# Patient Record
Sex: Male | Born: 1995
Health system: Southern US, Community
[De-identification: ages and names within clinical notes are randomized; demographics above are authoritative.]

## PROBLEM LIST (undated history)

## (undated) DIAGNOSIS — R55 Syncope and collapse: Secondary | ICD-10-CM

## (undated) DIAGNOSIS — E162 Hypoglycemia, unspecified: Secondary | ICD-10-CM

## (undated) DIAGNOSIS — Z6830 Body mass index (BMI) 30.0-30.9, adult: Secondary | ICD-10-CM

## (undated) DIAGNOSIS — F419 Anxiety disorder, unspecified: Secondary | ICD-10-CM

## (undated) DIAGNOSIS — I429 Cardiomyopathy, unspecified: Secondary | ICD-10-CM

## (undated) DIAGNOSIS — U071 COVID-19: Secondary | ICD-10-CM

## (undated) DIAGNOSIS — K219 Gastro-esophageal reflux disease without esophagitis: Secondary | ICD-10-CM

## (undated) DIAGNOSIS — D693 Immune thrombocytopenic purpura: Secondary | ICD-10-CM

## (undated) HISTORY — PX: NO PAST SURGERIES: SHX2092

## (undated) HISTORY — DX: Syncope and collapse: R55

## (undated) HISTORY — DX: Immune thrombocytopenic purpura: D69.3

## (undated) HISTORY — DX: Anxiety disorder, unspecified: F41.9

## (undated) HISTORY — DX: Body mass index (BMI) 30.0-30.9, adult: Z68.30

## (undated) HISTORY — DX: Gastro-esophageal reflux disease without esophagitis: K21.9

## (undated) HISTORY — DX: Cardiomyopathy, unspecified: I42.9

---

## 2016-11-18 DIAGNOSIS — S4992XA Unspecified injury of left shoulder and upper arm, initial encounter: Secondary | ICD-10-CM | POA: Diagnosis not present

## 2016-11-18 DIAGNOSIS — M25522 Pain in left elbow: Secondary | ICD-10-CM | POA: Diagnosis not present

## 2018-02-06 DIAGNOSIS — Z23 Encounter for immunization: Secondary | ICD-10-CM | POA: Diagnosis not present

## 2018-02-06 DIAGNOSIS — Z6828 Body mass index (BMI) 28.0-28.9, adult: Secondary | ICD-10-CM | POA: Diagnosis not present

## 2018-02-18 DIAGNOSIS — J020 Streptococcal pharyngitis: Secondary | ICD-10-CM | POA: Diagnosis not present

## 2018-03-09 DIAGNOSIS — Z6828 Body mass index (BMI) 28.0-28.9, adult: Secondary | ICD-10-CM | POA: Diagnosis not present

## 2018-04-16 DIAGNOSIS — Z6827 Body mass index (BMI) 27.0-27.9, adult: Secondary | ICD-10-CM | POA: Diagnosis not present

## 2018-04-16 DIAGNOSIS — F902 Attention-deficit hyperactivity disorder, combined type: Secondary | ICD-10-CM | POA: Diagnosis not present

## 2018-07-07 DIAGNOSIS — M542 Cervicalgia: Secondary | ICD-10-CM | POA: Diagnosis not present

## 2018-07-07 DIAGNOSIS — M546 Pain in thoracic spine: Secondary | ICD-10-CM | POA: Diagnosis not present

## 2018-07-07 DIAGNOSIS — M9902 Segmental and somatic dysfunction of thoracic region: Secondary | ICD-10-CM | POA: Diagnosis not present

## 2018-07-07 DIAGNOSIS — M9901 Segmental and somatic dysfunction of cervical region: Secondary | ICD-10-CM | POA: Diagnosis not present

## 2018-07-09 DIAGNOSIS — M9902 Segmental and somatic dysfunction of thoracic region: Secondary | ICD-10-CM | POA: Diagnosis not present

## 2018-07-09 DIAGNOSIS — M546 Pain in thoracic spine: Secondary | ICD-10-CM | POA: Diagnosis not present

## 2018-07-09 DIAGNOSIS — M542 Cervicalgia: Secondary | ICD-10-CM | POA: Diagnosis not present

## 2018-07-09 DIAGNOSIS — M9901 Segmental and somatic dysfunction of cervical region: Secondary | ICD-10-CM | POA: Diagnosis not present

## 2018-10-15 DIAGNOSIS — Z1331 Encounter for screening for depression: Secondary | ICD-10-CM | POA: Diagnosis not present

## 2018-10-15 DIAGNOSIS — F321 Major depressive disorder, single episode, moderate: Secondary | ICD-10-CM | POA: Diagnosis not present

## 2018-10-15 DIAGNOSIS — F411 Generalized anxiety disorder: Secondary | ICD-10-CM | POA: Diagnosis not present

## 2018-10-15 DIAGNOSIS — Z6828 Body mass index (BMI) 28.0-28.9, adult: Secondary | ICD-10-CM | POA: Diagnosis not present

## 2018-11-16 DIAGNOSIS — F411 Generalized anxiety disorder: Secondary | ICD-10-CM | POA: Diagnosis not present

## 2018-11-16 DIAGNOSIS — Z20828 Contact with and (suspected) exposure to other viral communicable diseases: Secondary | ICD-10-CM | POA: Diagnosis not present

## 2018-11-16 DIAGNOSIS — F321 Major depressive disorder, single episode, moderate: Secondary | ICD-10-CM | POA: Diagnosis not present

## 2018-11-19 DIAGNOSIS — J209 Acute bronchitis, unspecified: Secondary | ICD-10-CM | POA: Diagnosis not present

## 2018-11-19 DIAGNOSIS — R05 Cough: Secondary | ICD-10-CM | POA: Diagnosis not present

## 2019-02-16 DIAGNOSIS — R05 Cough: Secondary | ICD-10-CM | POA: Diagnosis not present

## 2019-02-16 DIAGNOSIS — R519 Headache, unspecified: Secondary | ICD-10-CM | POA: Diagnosis not present

## 2019-02-19 DIAGNOSIS — Z20828 Contact with and (suspected) exposure to other viral communicable diseases: Secondary | ICD-10-CM | POA: Diagnosis not present

## 2019-02-19 DIAGNOSIS — R438 Other disturbances of smell and taste: Secondary | ICD-10-CM | POA: Diagnosis not present

## 2019-02-19 DIAGNOSIS — R0602 Shortness of breath: Secondary | ICD-10-CM | POA: Diagnosis not present

## 2019-02-19 DIAGNOSIS — R05 Cough: Secondary | ICD-10-CM | POA: Diagnosis not present

## 2019-02-20 DIAGNOSIS — R200 Anesthesia of skin: Secondary | ICD-10-CM | POA: Diagnosis not present

## 2019-02-20 DIAGNOSIS — R569 Unspecified convulsions: Secondary | ICD-10-CM | POA: Diagnosis not present

## 2019-02-20 DIAGNOSIS — R55 Syncope and collapse: Secondary | ICD-10-CM | POA: Diagnosis not present

## 2019-02-20 DIAGNOSIS — R202 Paresthesia of skin: Secondary | ICD-10-CM | POA: Diagnosis not present

## 2019-02-20 DIAGNOSIS — U071 COVID-19: Secondary | ICD-10-CM | POA: Diagnosis not present

## 2019-02-21 ENCOUNTER — Inpatient Hospital Stay (HOSPITAL_COMMUNITY)
Admission: AD | Admit: 2019-02-21 | Discharge: 2019-02-23 | DRG: 312 | Disposition: A | Discharge status: Home / Self Care | Payer: Federal, State, Local not specified - PPO | Source: Other Acute Inpatient Hospital | Attending: Internal Medicine | Admitting: Internal Medicine

## 2019-02-21 ENCOUNTER — Other Ambulatory Visit: Payer: Self-pay

## 2019-02-21 ENCOUNTER — Encounter (HOSPITAL_COMMUNITY): Payer: Self-pay

## 2019-02-21 DIAGNOSIS — R001 Bradycardia, unspecified: Secondary | ICD-10-CM | POA: Diagnosis not present

## 2019-02-21 DIAGNOSIS — I428 Other cardiomyopathies: Secondary | ICD-10-CM | POA: Diagnosis present

## 2019-02-21 DIAGNOSIS — R55 Syncope and collapse: Principal | ICD-10-CM | POA: Diagnosis present

## 2019-02-21 DIAGNOSIS — I5020 Unspecified systolic (congestive) heart failure: Secondary | ICD-10-CM

## 2019-02-21 DIAGNOSIS — Z8241 Family history of sudden cardiac death: Secondary | ICD-10-CM

## 2019-02-21 DIAGNOSIS — E11649 Type 2 diabetes mellitus with hypoglycemia without coma: Secondary | ICD-10-CM | POA: Diagnosis not present

## 2019-02-21 DIAGNOSIS — G40909 Epilepsy, unspecified, not intractable, without status epilepticus: Secondary | ICD-10-CM | POA: Diagnosis present

## 2019-02-21 DIAGNOSIS — R202 Paresthesia of skin: Secondary | ICD-10-CM | POA: Diagnosis not present

## 2019-02-21 DIAGNOSIS — Z7289 Other problems related to lifestyle: Secondary | ICD-10-CM

## 2019-02-21 DIAGNOSIS — U071 COVID-19: Secondary | ICD-10-CM | POA: Diagnosis not present

## 2019-02-21 DIAGNOSIS — R4020 Unspecified coma: Secondary | ICD-10-CM | POA: Diagnosis not present

## 2019-02-21 DIAGNOSIS — Z87891 Personal history of nicotine dependence: Secondary | ICD-10-CM | POA: Diagnosis not present

## 2019-02-21 DIAGNOSIS — R569 Unspecified convulsions: Secondary | ICD-10-CM

## 2019-02-21 HISTORY — DX: COVID-19: U07.1

## 2019-02-21 HISTORY — DX: Unspecified convulsions: R56.9

## 2019-02-21 HISTORY — DX: Syncope and collapse: R55

## 2019-02-21 HISTORY — DX: Hypoglycemia, unspecified: E16.2

## 2019-02-21 MED ORDER — ASPIRIN EC 81 MG PO TBEC
81.0000 mg | DELAYED_RELEASE_TABLET | Freq: Every day | ORAL | Status: DC
  Administered 2019-02-22 – 2019-02-23 (×2): 81 mg via ORAL
  Filled 2019-02-21 (×2): qty 1

## 2019-02-21 MED ORDER — SODIUM CHLORIDE 0.9% FLUSH: 3.0000 mL | INTRAVENOUS | Status: DC | PRN

## 2019-02-21 MED ORDER — VITAMIN C 500 MG PO TABS
500.0000 mg | ORAL_TABLET | Freq: Every day | ORAL | Status: DC
  Administered 2019-02-22 – 2019-02-23 (×2): 500 mg via ORAL
  Filled 2019-02-21 (×2): qty 1

## 2019-02-21 MED ORDER — ACETAMINOPHEN 325 MG PO TABS
650.0000 mg | ORAL_TABLET | Freq: Four times a day (QID) | ORAL | Status: DC | PRN
  Administered 2019-02-21 – 2019-02-22 (×2): 650 mg via ORAL
  Filled 2019-02-21 (×2): qty 2

## 2019-02-21 MED ORDER — ENOXAPARIN SODIUM 40 MG/0.4ML ~~LOC~~ SOLN
40.0000 mg | SUBCUTANEOUS | Status: DC
  Administered 2019-02-22 – 2019-02-23 (×2): 40 mg via SUBCUTANEOUS
  Filled 2019-02-21 (×2): qty 0.4

## 2019-02-21 MED ORDER — SODIUM CHLORIDE 0.9 % IV SOLN: 250.0000 mL | INTRAVENOUS | Status: DC | PRN

## 2019-02-21 MED ORDER — SODIUM CHLORIDE 0.9% FLUSH
3.0000 mL | Freq: Two times a day (BID) | INTRAVENOUS | Status: DC
  Administered 2019-02-22 (×2): 3 mL via INTRAVENOUS

## 2019-02-21 MED ORDER — ZINC SULFATE 220 (50 ZN) MG PO CAPS
220.0000 mg | ORAL_CAPSULE | Freq: Every day | ORAL | Status: DC
  Administered 2019-02-22 – 2019-02-23 (×2): 220 mg via ORAL
  Filled 2019-02-21 (×2): qty 1

## 2019-02-21 MED ORDER — FOLIC ACID 1 MG PO TABS
1.0000 mg | ORAL_TABLET | Freq: Every day | ORAL | Status: DC
  Administered 2019-02-22 – 2019-02-23 (×2): 1 mg via ORAL
  Filled 2019-02-21 (×2): qty 1

## 2019-02-21 MED ORDER — ADULT MULTIVITAMIN W/MINERALS CH
1.0000 | ORAL_TABLET | Freq: Every day | ORAL | Status: DC
  Administered 2019-02-22 – 2019-02-23 (×2): 1 via ORAL
  Filled 2019-02-21 (×2): qty 1

## 2019-02-22 ENCOUNTER — Encounter (HOSPITAL_COMMUNITY): Payer: Self-pay | Admitting: Neurology

## 2019-02-22 ENCOUNTER — Inpatient Hospital Stay (HOSPITAL_COMMUNITY): Payer: Federal, State, Local not specified - PPO

## 2019-02-22 ENCOUNTER — Observation Stay (HOSPITAL_COMMUNITY): Payer: Federal, State, Local not specified - PPO

## 2019-02-22 ENCOUNTER — Observation Stay (HOSPITAL_BASED_OUTPATIENT_CLINIC_OR_DEPARTMENT_OTHER): Payer: Federal, State, Local not specified - PPO

## 2019-02-22 DIAGNOSIS — Z87891 Personal history of nicotine dependence: Secondary | ICD-10-CM | POA: Diagnosis not present

## 2019-02-22 DIAGNOSIS — Z8241 Family history of sudden cardiac death: Secondary | ICD-10-CM | POA: Diagnosis not present

## 2019-02-22 DIAGNOSIS — I428 Other cardiomyopathies: Secondary | ICD-10-CM | POA: Diagnosis not present

## 2019-02-22 DIAGNOSIS — R569 Unspecified convulsions: Secondary | ICD-10-CM | POA: Diagnosis not present

## 2019-02-22 DIAGNOSIS — U071 COVID-19: Secondary | ICD-10-CM | POA: Diagnosis not present

## 2019-02-22 DIAGNOSIS — R001 Bradycardia, unspecified: Secondary | ICD-10-CM | POA: Diagnosis not present

## 2019-02-22 DIAGNOSIS — R4020 Unspecified coma: Secondary | ICD-10-CM | POA: Diagnosis not present

## 2019-02-22 DIAGNOSIS — R55 Syncope and collapse: Secondary | ICD-10-CM

## 2019-02-22 DIAGNOSIS — E11649 Type 2 diabetes mellitus with hypoglycemia without coma: Secondary | ICD-10-CM | POA: Diagnosis present

## 2019-02-22 DIAGNOSIS — I5020 Unspecified systolic (congestive) heart failure: Secondary | ICD-10-CM | POA: Diagnosis present

## 2019-02-22 DIAGNOSIS — Z7289 Other problems related to lifestyle: Secondary | ICD-10-CM | POA: Diagnosis not present

## 2019-02-22 DIAGNOSIS — G40909 Epilepsy, unspecified, not intractable, without status epilepticus: Secondary | ICD-10-CM | POA: Diagnosis present

## 2019-02-22 HISTORY — DX: Unspecified coma: R40.20

## 2019-02-22 HISTORY — DX: Unspecified systolic (congestive) heart failure: I50.20

## 2019-02-22 LAB — COMPREHENSIVE METABOLIC PANEL
ALT: 16 U/L (ref 0–44)
AST: 16 U/L (ref 15–41)
Albumin: 4 g/dL (ref 3.5–5.0)
Alkaline Phosphatase: 49 U/L (ref 38–126)
Anion gap: 9 (ref 5–15)
BUN: 11 mg/dL (ref 6–20)
CO2: 26 mmol/L (ref 22–32)
Calcium: 9 mg/dL (ref 8.9–10.3)
Chloride: 103 mmol/L (ref 98–111)
Creatinine, Ser: 0.96 mg/dL (ref 0.61–1.24)
GFR calc Af Amer: >60 mL/min (ref >60)
GFR calc non Af Amer: >60 mL/min (ref >60)
Glucose, Bld: 95 mg/dL (ref 70–99)
Potassium: 4.2 mmol/L (ref 3.5–5.1)
Sodium: 138 mmol/L (ref 135–145)
Total Bilirubin: 0.7 mg/dL (ref 0.3–1.2)
Total Protein: 6.5 g/dL (ref 6.5–8.1)

## 2019-02-22 LAB — CBC WITH DIFFERENTIAL/PLATELET
Abs Immature Granulocytes: 0.03 10*3/uL (ref 0.00–0.07)
Basophils Absolute: 0 10*3/uL (ref 0.0–0.1)
Basophils Relative: 0 %
Eosinophils Absolute: 0.1 10*3/uL (ref 0.0–0.5)
Eosinophils Relative: 1 %
HCT: 45.2 % (ref 39.0–52.0)
Hemoglobin: 15.3 g/dL (ref 13.0–17.0)
Immature Granulocytes: 1 %
Lymphocytes Relative: 28 %
Lymphs Abs: 1.7 10*3/uL (ref 0.7–4.0)
MCH: 28.9 pg (ref 26.0–34.0)
MCHC: 33.8 g/dL (ref 30.0–36.0)
MCV: 85.3 fL (ref 80.0–100.0)
Monocytes Absolute: 0.6 10*3/uL (ref 0.1–1.0)
Monocytes Relative: 10 %
Neutro Abs: 3.7 10*3/uL (ref 1.7–7.7)
Neutrophils Relative %: 60 %
Platelets: 175 10*3/uL (ref 150–400)
RBC: 5.3 MIL/uL (ref 4.22–5.81)
RDW: 12.3 % (ref 11.5–15.5)
WBC: 6.2 10*3/uL (ref 4.0–10.5)
nRBC: 0 % (ref 0.0–0.2)

## 2019-02-22 LAB — HIV ANTIBODY (ROUTINE TESTING W REFLEX): HIV Screen 4th Generation wRfx: NONREACTIVE

## 2019-02-22 LAB — TROPONIN I (HIGH SENSITIVITY)
Troponin I (High Sensitivity): 2 ng/L (ref <18)
Troponin I (High Sensitivity): 3 ng/L (ref <18)

## 2019-02-22 LAB — FIBRINOGEN: Fibrinogen: 318 mg/dL (ref 210–475)

## 2019-02-22 LAB — D-DIMER, QUANTITATIVE: D-Dimer, Quant: <0.27 ug/mL-FEU (ref 0.00–0.50)

## 2019-02-22 LAB — ABO/RH: ABO/RH(D): A POS

## 2019-02-22 LAB — PROCALCITONIN: Procalcitonin: <0.1 ng/mL

## 2019-02-22 LAB — ECHOCARDIOGRAM COMPLETE
Height: 74 in
Weight: 3650.82 oz

## 2019-02-22 LAB — LACTATE DEHYDROGENASE: LDH: 111 U/L (ref 98–192)

## 2019-02-22 LAB — FERRITIN: Ferritin: 66 ng/mL (ref 24–336)

## 2019-02-22 LAB — TSH: TSH: 1.765 u[IU]/mL (ref 0.350–4.500)

## 2019-02-22 LAB — C-REACTIVE PROTEIN: CRP: <0.8 mg/dL (ref <1.0)

## 2019-02-22 MED ORDER — GADOBUTROL 1 MMOL/ML IV SOLN
10.0000 mL | Freq: Once | INTRAVENOUS | Status: AC | PRN
  Administered 2019-02-22: 10 mL via INTRAVENOUS

## 2019-02-22 MED ORDER — TRAMADOL HCL 50 MG PO TABS
50.0000 mg | ORAL_TABLET | Freq: Four times a day (QID) | ORAL | Status: DC | PRN
  Administered 2019-02-22 – 2019-02-23 (×2): 50 mg via ORAL
  Filled 2019-02-22 (×2): qty 1

## 2019-02-22 MED ORDER — ONDANSETRON HCL 4 MG/2ML IJ SOLN
4.0000 mg | Freq: Four times a day (QID) | INTRAMUSCULAR | Status: DC | PRN
  Administered 2019-02-22: 4 mg via INTRAVENOUS
  Filled 2019-02-22: qty 2

## 2019-02-22 MED ORDER — KETOROLAC TROMETHAMINE 15 MG/ML IJ SOLN: 15.0000 mg | Freq: Two times a day (BID) | INTRAMUSCULAR | Status: DC

## 2019-02-22 MED ORDER — LEVETIRACETAM 500 MG PO TABS
500.0000 mg | ORAL_TABLET | Freq: Two times a day (BID) | ORAL | Status: DC
  Administered 2019-02-22 – 2019-02-23 (×3): 500 mg via ORAL
  Filled 2019-02-22 (×3): qty 1
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
## 2019-02-22 NOTE — Procedures (Signed)
Patient Name: Jesus Lopez  MRN: [id number]  Epilepsy Attending: Lora Havens  Referring Physician/Provider: Dr. Kerney Elbe Date: 02/22/2019 Duration: 23.05 minutes  Patient history: 23 year old male with episodes of passing out with seizure-like activity.  EEG to evaluate for seizures.  Level of alertness: Awake, sleep  AEDs during EEG study: Keppra  Technical aspects: This EEG study was done with scalp electrodes positioned according to the 10-20 International system of electrode placement. Electrical activity was acquired at a sampling rate of 500Hz  and reviewed with a high frequency filter of 70Hz  and a low frequency filter of 1Hz . EEG data were recorded continuously and digitally stored.  Description: During awake state, the posterior dominant rhythm consists of 10-11 Hz activity of moderate voltage (25-35 uV) seen predominantly in posterior head regions, symmetric and reactive to eye opening and eye closing.  Sleep was characterized by vertex waves, sleep spindles (12 to 14 Hz), K complexes, maximal frontocentral.  Physiologic photic driving was not seen during photic stimulation.  No EEG change was seen during hyperventilation.  IMPRESSION: This study is within normal limits. No seizures or epileptiform discharges were seen throughout the recording.  Jesus Lopez
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
## 2019-02-22 NOTE — Progress Notes (Signed)
EEG Completed; Results Pending  

## 2019-02-22 NOTE — Consult Note (Addendum)
Cardiology Consultation:   Patient ID: Jesus Lopez MRN: [id number]; DOB: August 06, 1995  Admit date: 02/21/2019 Date of Consult: 02/22/2019  Primary Care Provider: No primary care provider on file. Primary Cardiologist: Candee Furbish, MD  Primary Electrophysiologist:  None    Patient Profile:   Jesus Lopez is a 23 y.o. male with a hx of COVID positive test last Friday who is being seen today for the evaluation of syncope at the request of Dr. Lonny Prude.  History of Present Illness:   [name] has not seen cardiology before. The patient was seen in an Urgent care on Friday and tested positive for COVID.  He denies history of heart disease, cancer, stroke, diabetes, HTN, HLD, or seizure.   The patient presented to the [facility] ED 02/21/19 for multiple episodes of loss of conciousness. The first was on July 4 and he said this one was the worst. He had a significant amount of alcohol before he experienced LOC and had witnessed convulsions. 2 weeks ago he had another event after standing up from sitting position. He had some left sided numbness and confusion afterwards. And then on Saturday night he passed out during dinner and woke up a few minutes later with left sided numbness. Before he fainted he felt SOB and nausea and felt his vision tunneling. No CP or palpitations. After the third episode he decided to go to the ED for evaluation. CT head was negative for acute process. EKG without acute changes. COVID positive. Urine drug screen was negative. Patient was started on Keppra per neurology and was transferred to [facility] for further work-up.  On arrival to [facility] labs showed potassium 4.2, glucose 95, creatinine 0.96. WBC 6.2, Hgb 15.3, CRP normal. Procalcitonin normal. Hs troponin 2 > 3. D-dimer negative. EKG with no ischemic changes. Neurology following. Cardiology was consulted.  Patient quit smoking 2 weeks ago; occasional alcohol use. Denies drug use. Family history is positive for cardiac arrest  preceded by VT ? (fast heart rhythm).   Heart Pathway Score:     Past Medical History:  Diagnosis Date  . COVID-19   . Hypoglycemia   . Syncope     History reviewed. No pertinent surgical history.   Home Medications:  Prior to Admission medications   Medication Sig Start Date End Date Taking? Authorizing Provider  ibuprofen (ADVIL) 800 MG tablet Take 800 mg by mouth every 6 (six) hours as needed for pain. 02/19/19  Yes [provider]    Inpatient Medications: Scheduled Meds: . aspirin EC  81 mg Oral Daily  . enoxaparin (LOVENOX) injection  40 mg Subcutaneous Q24H  . folic acid  1 mg Oral Daily  . levETIRAcetam  500 mg Oral BID  . multivitamin with minerals  1 tablet Oral Daily  . sodium chloride flush  3 mL Intravenous Q12H  . vitamin C  500 mg Oral Daily  . zinc sulfate  220 mg Oral Daily   Continuous Infusions: . sodium chloride     PRN Meds: sodium chloride, acetaminophen, ondansetron (ZOFRAN) IV, sodium chloride flush, traMADol  Allergies:   No Known Allergies  Social History:   Social History   Socioeconomic History  . Marital status: Single    Spouse name: Not on file  . Number of children: Not on file  . Years of education: Not on file  . Highest education level: Not on file  Occupational History  . Not on file  Social Needs  . Financial resource strain: Not on file  .  Food insecurity    Worry: Not on file    Inability: Not on file  . Transportation needs    Medical: Not on file    Non-medical: Not on file  Tobacco Use  . Smoking status: Former Smoker    Types: Cigarettes    Quit date: 02/07/2019    Years since quitting: 0.0  . Smokeless tobacco: Never Used  Substance and Sexual Activity  . Alcohol use: Yes  . Drug use: Never  . Sexual activity: Yes  Lifestyle  . Physical activity    Days per week: Not on file    Minutes per session: Not on file  . Stress: Not on file  Relationships  . Social Musician on phone: Not  on file    Gets together: Not on file    Attends religious service: Not on file    Active member of club or organization: Not on file    Attends meetings of clubs or organizations: Not on file    Relationship status: Not on file  . Intimate partner violence    Fear of current or ex partner: Not on file    Emotionally abused: Not on file    Physically abused: Not on file    Forced sexual activity: Not on file  Other Topics Concern  . Not on file  Social History Narrative  . Not on file    Family History:   Family History  Problem Relation Age of Onset  . Hypertension Mother   . Hypertension Father   . Arrhythmia Father        with cardiac arrest     ROS:  Please see the history of present illness.  All other ROS reviewed and negative.     Physical Exam/Data:   Vitals:   02/21/19 2200 02/22/19 0031 02/22/19 0900  BP: 122/75 120/65 119/73  Pulse: (!) 50 64 62  Resp: 20 12 12   Temp: 97.8 F (36.6 C) 97.6 F (36.4 C) 98 F (36.7 C)  TempSrc: Oral Oral Oral  SpO2: 100% 100%   Weight: 103.5 kg    Height: 6\' 2"  (1.88 m)      Intake/Output Summary (Last 24 hours) at 02/22/2019 1521 Last data filed at 02/22/2019 1445 Gross per 24 hour  Intake 720 ml  Output -  Net 720 ml   Last 3 Weights 02/21/2019  Weight (lbs) 228 lb 2.8 oz  Weight (kg) 103.5 kg     Body mass index is 29.3 kg/m.  General:  Well nourished, well developed, in no acute distress HEENT: normal Lymph: no adenopathy Neck: no JVD Endocrine:  No thryomegaly Vascular: No carotid bruits; FA pulses 2+ bilaterally without bruits  Cardiac:  normal S1, S2; RRR; no murmur  Lungs:  clear to auscultation bilaterally, no wheezing, rhonchi or rales  Abd: soft, nontender, no hepatomegaly  Ext: no edema Musculoskeletal:  No deformities, BUE and BLE strength normal and equal Skin: warm and dry  Neuro:  CNs 2-12 intact, no focal abnormalities noted Psych:  Normal affect   EKG:  The EKG was personally  reviewed and demonstrates:  NSR, 69 bpm, QTC 422 ms, minor repol abnormalities.  Telemetry:  Telemetry was personally reviewed and demonstrates:  N/A  Relevant CV Studies:  Echo  1. Left ventricular ejection fraction, by visual estimation, is 40 to 45%. The left ventricle has moderately decreased function. There is no left ventricular hypertrophy.  2. Left ventricular diastolic parameters are indeterminate.  3.  The left ventricle demonstrates global hypokinesis.  4. Global right ventricle has normal systolic function.The right ventricular size is normal. No increase in right ventricular wall thickness.  5. Left atrial size was normal.  6. Right atrial size was normal.  7. The mitral valve is normal in structure. Trace mitral valve regurgitation.  8. The tricuspid valve is normal in structure. Tricuspid valve regurgitation is trivial.  9. The aortic valve is tricuspid. Aortic valve regurgitation is not visualized. No evidence of aortic valve sclerosis or stenosis. 10. The pulmonic valve was grossly normal. Pulmonic valve regurgitation is not visualized. 11. TR signal is inadequate for assessing pulmonary artery systolic pressure. 12. The inferior vena cava is dilated in size with <50% respiratory variability, suggesting right atrial pressure of 15 mmHg. 13. Hypermobile linear mass in right atrium, appears consistent with Chiari. However, cannot exclude fibrin or thormbus based on this study.  Laboratory Data:  High Sensitivity Troponin:   Recent Labs  Lab 02/22/19 0106  TROPONINIHS 3  2     Chemistry Recent Labs  Lab 02/22/19 0106  NA 138  K 4.2  CL 103  CO2 26  GLUCOSE 95  BUN 11  CREATININE 0.96  CALCIUM 9.0  GFRNONAA >60  GFRAA >60  ANIONGAP 9    Recent Labs  Lab 02/22/19 0106  PROT 6.5  ALBUMIN 4.0  AST 16  ALT 16  ALKPHOS 49  BILITOT 0.7   Hematology Recent Labs  Lab 02/22/19 0106  WBC 6.2  RBC 5.30  HGB 15.3  HCT 45.2  MCV 85.3  MCH 28.9  MCHC  33.8  RDW 12.3  PLT 175   BNPNo results for input(s): BNP, PROBNP in the last 168 hours.  DDimer  Recent Labs  Lab 02/22/19 0106  DDIMER <0.27     Radiology/Studies:  No results found.  Assessment and Plan:   Syncope Patient presented with multiple episodes of syncope with prodrome of tunnel vision - possibly seizure vs vasovagal - CT head negative for acute process - Neurology following. 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 Will possibly outpatient follow-up for possible cardiac MRI.  - HR in the 50s >> Would hold on BB at this time - MD to see  For questions or updates, please contact [facility] Please consult [email or web address] for contact info under     Signed, Cadence David StallH Furth, PA-C  02/22/2019 3:21 PM   Personally seen and examined. Agree with above.   23 year old male here with Covid positive test last Friday with a few syncopal episodes last one about 6 months ago all in the genre of vasovagal syncope.  Interestingly however he does have an EF of approximately 40 to 45% on echocardiogram.  Personally reviewed.  No hypertrophic changes.  States that at times he will get shaky, anxious, adrenaline rush.  Feels some numbness in his body.  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  Physical exam performed as above, see Fransico MichaelFurth PA.  Overall General alert and oriented  x3 no acute distress, no respiratory distress able to complete full  sentences without difficulty.  Troponin normal.  EKG unremarkable.  No hypertrophic cardiomyopathy-like changes.  Assessment and plan:  Vasovagal syncope -His description of his prior syncopal episodes first of which occurred 6 months prior to his Covid infection, do sound like they are in the genre of vasovagal syncope.  Continue with adequate hydration.  If he starts to feel these experiences come on he knows to sit down or lay down and put his feet up.  Sounds like at times he may hyperventilate causing paresthesias/tingling which can precipitate vasovagal syncope. -Nonetheless, we will order a Zio patch monitor 14-day for him as outpatient after his telemetry is done.  Reduced ejection fraction -EF 40 to 45% on echocardiogram personally reviewed.  This is lower than expected.  I think it would behoove [place] after he is over his Covid infection to have a cardiac MRI performed.  We will see him back as outpatient and have this arranged.  Family history of cardiac arrest -Currently no VT on monitoring.  I think it is important to perform Zio patch monitor.  No evidence of thickened left ventricle or hypertrophic cardiomyopathy at this point morphologically.  Checking MRI  Donato Schultz, MD

## 2019-02-22 NOTE — Progress Notes (Signed)
Orthostatic hypotension evaluation \\lying    119/73 Sitting   131/78 Standing  120/84

## 2019-02-22 NOTE — Progress Notes (Signed)
[facility]  PROGRESS NOTE  Jesus Lopez TKW:[id number] DOB: 10-09-1995 DOA: 02/21/2019 PCP: No primary care provider on file. Admit date - 02/21/2019   Admitting Physician Assunta Found, MD  Outpatient Primary MD for the patient is No primary care provider on file.  LOS - 1 Brief Narrative   Jesus Lopez is a 23 y.o. year old male with medical history significant for recent COVID-19 diagnosis who presented to [facility] ED on 02/21/2019 with episode of loss of consciousness while sitting on couch at dinner, preceded by a blue light in his vision. He reports confusion without fecal/urine incontinence afterwards but did have mouth numbness ( and witnessed slurred speech per his girlfriend) as well as left-sided numbness (face and arm) afterwards that resolved in 15 minutes.   Reports previous episode 2 weeks ago after standing up from sex felt lightheaded and weak. He passed out and his girlfriend witnessed whole body convulsions. He again reported having mouth numbness and total body numbness as well as full body aches. No fecal/urine incontinence at that time.   On July 4 after 8 beers and liquor he had witnessed convulsions.  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 He states he was diagnosed with hypoglycemia by his PCP.  He has a glucometer but it hasn't been working lately. Reports gluc of 89 at [place] ED prior to transfer     Transferred from [facility] ED due to concern for syncopal episode versus seizure-like activities and /or TIA.    Subjective  Mr.  Fenter today has headache, back pain, . Denies any current numbness or tingling or difficulty speaking.    A & P   Episodes of loss of consciousness.  Has had multiple episodes in the past few months. Weakness and numbness afterwards concerning for TIA. He reports associated with hypoglycemia but none  here so far. Also seizure on differential given confusion afterwards. Likely all worsened in the setting of COVID infection.  CT scan and UDS at outside hospital negative. Orthostatic vitals this am wnl.  Patient was loaded empirically with Keppra.  Neurology consulted, no seizure-like activity on spot EEG, needs MRI to rule out focal abnormalities that could be contributing to episode of loss of consciousness.  Patient needs to be admitted and monitored overnight while awaiting MRI imaging as well as monitoring of telemetry given family history of VT per cardiology,   Reduced EF of unclear etiology with family history of cardiac arrest.  Found on TTE on work-up of above.  Compensated on exam cardiology recommends cardiac MRI as an outpatient once he has recovered from his Covid infection.  No hypertrophic findings on TTE.  Plan for Zio patch monitor-outpatient 14 days after monitoring on telemetry overnight.  Covid infection positive.  No respiratory symptoms, on room air.  Serial monitoring markers, continue vitamin C and zinc    Family Communication  :  No family at bedside  Code Status :FULL  Disposition Plan  : Change status from observation to inpatient given ruling out potential seizure/stroke/TIA as well as cardiac etiologies.  Need to continue monitor on telemetry given history of VT and family before potential discharge For 40 hours, additionally awaiting MRI brain fluid any focal abnormalities that could be contributing to episodic loss of consciousness.  Consults  :  Neurology  Procedures  :  MRI Brain, telemetry monitoring  DVT Prophylaxis  :  Lovenox   Lab Results  Component Value Date   PLT 175 02/22/2019    Diet :  Diet Order            Diet regular Room service appropriate? Yes; Fluid consistency: Thin  Diet effective now               Inpatient Medications Scheduled Meds: . aspirin EC  81 mg Oral Daily  . enoxaparin (LOVENOX) injection  40 mg Subcutaneous Q24H   . folic acid  1 mg Oral Daily  . multivitamin with minerals  1 tablet Oral Daily  . sodium chloride flush  3 mL Intravenous Q12H  . vitamin C  500 mg Oral Daily  . zinc sulfate  220 mg Oral Daily   Continuous Infusions: . sodium chloride     PRN Meds:.sodium chloride, acetaminophen, sodium chloride flush  Antibiotics  :   Anti-infectives (From admission, onward)   None       Objective   Vitals:   02/21/19 2200 02/22/19 0031  BP: 122/75 120/65  Pulse: (!) 50 64  Resp: 20 12  Temp: 97.8 F (36.6 C) 97.6 F (36.4 C)  TempSrc: Oral Oral  SpO2: 100% 100%  Weight: 103.5 kg   Height: 6\' 2"  (1.88 m)     SpO2: 100 %  Wt Readings from Last 3 Encounters:  02/21/19 103.5 kg     Intake/Output Summary (Last 24 hours) at 02/22/2019 0834 Last data filed at 02/21/2019 2308 Gross per 24 hour  Intake 240 ml  Output -  Net 240 ml    Physical Exam:  Awake Alert, Oriented X 3, Normal affect Follows commands, 5/5 strengthh in bilateral upper and lower extremities, no appreciable focal deficits [place].AT, No JVD Symmetrical Chest wall movement, Good air movement bilaterally, CTAB RRR,No Gallops,Rubs or new Murmurs,  +ve B.Sounds, Abd Soft, No tenderness,  No rebound, guarding or rigidity. No Cyanosis, Clubbing or edema, No new Rash or bruise    I have personally reviewed the following:   Data Reviewed:  CBC Recent Labs  Lab 02/22/19 0106  WBC 6.2  HGB 15.3  HCT 45.2  PLT 175  MCV 85.3  MCH 28.9  MCHC 33.8  RDW 12.3  LYMPHSABS 1.7  MONOABS 0.6  EOSABS 0.1  BASOSABS 0.0    Chemistries  Recent Labs  Lab 02/22/19 0106  NA 138  K 4.2  CL 103  CO2 26  GLUCOSE 95  BUN 11  CREATININE 0.96  CALCIUM 9.0  AST 16  ALT 16  ALKPHOS 49  BILITOT 0.7   ------------------------------------------------------------------------------------------------------------------ No results for input(s): CHOL, HDL, LDLCALC, TRIG, CHOLHDL, LDLDIRECT in the last 72 hours.   No results found for: HGBA1C ------------------------------------------------------------------------------------------------------------------ No results for input(s): TSH, T4TOTAL, T3FREE, THYROIDAB in the last 72 hours.  Invalid input(s): FREET3 ------------------------------------------------------------------------------------------------------------------ Recent Labs    02/22/19 0106  FERRITIN 66    Coagulation profile No results for input(s): INR, PROTIME in the last 168 hours.  Recent Labs    02/22/19 0106  DDIMER <0.27    Cardiac Enzymes No results for input(s): CKMB, TROPONINI, MYOGLOBIN in the last 168 hours.  Invalid input(s): CK ------------------------------------------------------------------------------------------------------------------ No results found for: BNP  Micro Results No results found for this or any previous visit (from the past 240 hour(s)).  Radiology Reports No results found.   Time Spent in minutes  30     02/24/19 M.D on 02/22/2019 at 8:34 AM  To page go to [email or web address] -  password [facility]

## 2019-02-22 NOTE — Progress Notes (Signed)
Echocardiogram 2D Echocardiogram has been performed.  Oneal Deputy Poonam Woehrle 02/22/2019, 9:07 AM

## 2019-02-23 ENCOUNTER — Other Ambulatory Visit: Payer: Self-pay | Admitting: Medical

## 2019-02-23 ENCOUNTER — Encounter (HOSPITAL_COMMUNITY): Payer: Self-pay | Admitting: *Deleted

## 2019-02-23 ENCOUNTER — Telehealth: Payer: Self-pay | Admitting: Cardiology

## 2019-02-23 ENCOUNTER — Telehealth: Payer: Self-pay | Admitting: *Deleted

## 2019-02-23 DIAGNOSIS — I5020 Unspecified systolic (congestive) heart failure: Secondary | ICD-10-CM

## 2019-02-23 DIAGNOSIS — R55 Syncope and collapse: Secondary | ICD-10-CM

## 2019-02-23 DIAGNOSIS — R4020 Unspecified coma: Secondary | ICD-10-CM

## 2019-02-23 LAB — BASIC METABOLIC PANEL
Anion gap: 11 (ref 5–15)
BUN: 15 mg/dL (ref 6–20)
CO2: 26 mmol/L (ref 22–32)
Calcium: 9.1 mg/dL (ref 8.9–10.3)
Chloride: 105 mmol/L (ref 98–111)
Creatinine, Ser: 1.02 mg/dL (ref 0.61–1.24)
GFR calc Af Amer: >60 mL/min (ref >60)
GFR calc non Af Amer: >60 mL/min (ref >60)
Glucose, Bld: 90 mg/dL (ref 70–99)
Potassium: 4 mmol/L (ref 3.5–5.1)
Sodium: 142 mmol/L (ref 135–145)

## 2019-02-23 LAB — CBC
HCT: 45.4 % (ref 39.0–52.0)
Hemoglobin: 15.8 g/dL (ref 13.0–17.0)
MCH: 29.3 pg (ref 26.0–34.0)
MCHC: 34.8 g/dL (ref 30.0–36.0)
MCV: 84.2 fL (ref 80.0–100.0)
Platelets: 172 10*3/uL (ref 150–400)
RBC: 5.39 MIL/uL (ref 4.22–5.81)
RDW: 12 % (ref 11.5–15.5)
WBC: 7.2 10*3/uL (ref 4.0–10.5)
nRBC: 0 % (ref 0.0–0.2)

## 2019-02-23 MED ORDER — LEVETIRACETAM 500 MG PO TABS: 500.0000 mg | ORAL_TABLET | Freq: Two times a day (BID) | ORAL | 0 refills | Status: DC

## 2019-02-23 MED ORDER — ZINC SULFATE 220 (50 ZN) MG PO CAPS: 220.0000 mg | ORAL_CAPSULE | Freq: Every day | ORAL | 0 refills | Status: DC

## 2019-02-23 MED ORDER — ASCORBIC ACID 500 MG PO TABS: 500.0000 mg | ORAL_TABLET | Freq: Every day | ORAL | 0 refills | Status: DC

## 2019-02-23 NOTE — Telephone Encounter (Signed)
New Message  Cadence Kathlen Mody PA calling in to get 30 day live event monitor set up with patient. Please give patient a call to go over monitor.

## 2019-02-23 NOTE — Progress Notes (Signed)
Patient discharged per orders. Heart failure/seizure education done with time allowed for questions/concerns. Prescriptions discussed. Prescriptions sent electronically to patient's preferred pharmacy. Follow up appointments discussed. Patient taken to the main entrance by CNA for transportation provided by patient's girlfriend. Soyla Dryer RN

## 2019-02-23 NOTE — Progress Notes (Signed)
MRI brain normal.   EEG was also normal.   A/R: 23 year old male presenting with multiple spells where patient would lose consciousness in conjunction with presyncopal sensation, followed by convulsive activity.    1. Cardiology has seen the patient and given the patient's lower than expected EF, a cardiac MRI is being considered.  Also per Cardiology, to evaluate possible convulsive syncope, he will be monitored with a Zio patch for 14 days after discharge.   2. Although syncopal convulsions are now felt to be the most likely component of the DDx, epileptic seizures are also of high enough likelihood to justify continuing Keppra during admission and upon discharge. If Zio monitor reveals that the etiology was vasovagal syncope then Keppra taper should be considered. For this he will need close outpatient Neurology follow up.  3. Per [facility] statutes, patients with seizures are not allowed to drive until  they have been seizure-free for six months. Use caution when using heavy equipment or power tools. Avoid working on ladders or at heights. Take showers instead of baths. Ensure the water temperature is not too high on the home water heater. Do not go swimming alone. When caring for infants or small children, sit down when holding, feeding, or changing them to minimize risk of injury to the child in the event you have a seizure. Also, Maintain good sleep hygiene. Avoid alcohol. 4. Neurohospitalist service will sign off. Please call if there are additional questions.   Electronically signed: Dr. Kerney Elbe

## 2019-02-23 NOTE — Care Management (Signed)
CM spoke with pt via phone prior to discharge.  Pt is independent from home.  Pt informed CM that he has PCP in [address] [place].  Pt also informed CM that he is has Development worker, international aid through [employer].  Pt denied barriers with paying for prescriptions.  Pt has thermometer in the home.  Pts girlfriend will transport him home.  NO CM needs determined at this time - CM signing off

## 2019-02-23 NOTE — Discharge Instructions (Signed)
You were admitted for syncope (sudden loss of consciousness). This may be because of seizure.  You will need to continue taking your Keppra until seen by a neurologist.  Outpatient follow-up is provided in AVS. You also need to be seen by your cardiologist.  Appointment also addressed in your after visit summary.  They will order cardiac MRI as an outpatient as well as set you up with a Zio patch at discharge       Seizure Precautions Per [facility] statutes, patients with seizures are not allowed to drive until  they have been seizure-free for six months. Use caution when using heavy equipment or power tools. Avoid working on ladders or at heights. Take showers instead of baths. Ensure the water temperature is not too high on the home water heater. Do not go swimming alone. When caring for infants or small children, sit down when holding, feeding, or changing them to minimize risk of injury to the child in the event you have a seizure. Also, Maintain good sleep hygiene. Avoid alcohol.

## 2019-02-23 NOTE — Progress Notes (Addendum)
Progress Note  Patient Name: Jesus Lopez Date of Encounter: 02/23/2019  Primary Cardiologist: Donato Schultz, MD   Subjective   Patient is feeling good today. Ready to go home.   Inpatient Medications    Scheduled Meds:  aspirin EC  81 mg Oral Daily   enoxaparin (LOVENOX) injection  40 mg Subcutaneous Q24H   folic acid  1 mg Oral Daily   levETIRAcetam  500 mg Oral BID   multivitamin with minerals  1 tablet Oral Daily   sodium chloride flush  3 mL Intravenous Q12H   vitamin C  500 mg Oral Daily   zinc sulfate  220 mg Oral Daily   Continuous Infusions:  sodium chloride     PRN Meds: sodium chloride, acetaminophen, ondansetron (ZOFRAN) IV, sodium chloride flush, traMADol   Vital Signs    Vitals:   02/21/19 2200 02/22/19 0031 02/22/19 0900 02/22/19 1600  BP: 122/75 120/65 119/73 122/72  Pulse: (!) 50 64 62   Resp: 20 12 12 12   Temp: 97.8 F (36.6 C) 97.6 F (36.4 C) 98 F (36.7 C) 98.1 F (36.7 C)  TempSrc: Oral Oral Oral Oral  SpO2: 100% 100%    Weight: 103.5 kg     Height: 6\' 2"  (1.88 m)       Intake/Output Summary (Last 24 hours) at 02/23/2019 0931 Last data filed at 02/22/2019 1445 Gross per 24 hour  Intake 480 ml  Output --  Net 480 ml   Last 3 Weights 02/21/2019  Weight (lbs) 228 lb 2.8 oz  Weight (kg) 103.5 kg      Telemetry    NSR, HR 50-60s; bradycardia overnight, peak low 39 bpm - Personally Reviewed  ECG    No new - Personally Reviewed  Physical Exam   GEN: No acute distress.   Neck: No JVD Cardiac: RRR, no murmurs, rubs, or gallops.  Respiratory: Clear to auscultation bilaterally. GI: Soft, nontender, non-distended  MS: No edema; No deformity. Neuro:  Nonfocal  Psych: Normal affect   Labs    High Sensitivity Troponin:   Recent Labs  Lab 02/22/19 0106  TROPONINIHS 3   2      Chemistry Recent Labs  Lab 02/22/19 0106 02/23/19 0415  NA 138 142  K 4.2 4.0  CL 103 105  CO2 26 26  GLUCOSE 95 90  BUN 11 15   CREATININE 0.96 1.02  CALCIUM 9.0 9.1  PROT 6.5  --   ALBUMIN 4.0  --   AST 16  --   ALT 16  --   ALKPHOS 49  --   BILITOT 0.7  --   GFRNONAA >60 >60  GFRAA >60 >60  ANIONGAP 9 11     Hematology Recent Labs  Lab 02/22/19 0106 02/23/19 0415  WBC 6.2 7.2  RBC 5.30 5.39  HGB 15.3 15.8  HCT 45.2 45.4  MCV 85.3 84.2  MCH 28.9 29.3  MCHC 33.8 34.8  RDW 12.3 12.0  PLT 175 172    BNPNo results for input(s): BNP, PROBNP in the last 168 hours.   DDimer  Recent Labs  Lab 02/22/19 0106  DDIMER <0.27     Radiology    Mr Laqueta Jean Wo Contrast  Result Date: 02/22/2019 CLINICAL DATA:  23 year old male with recent COVID-19. Diagnosis. Seizure. EXAM: MRI HEAD WITHOUT AND WITH CONTRAST TECHNIQUE: Multiplanar, multiecho pulse sequences of the brain and surrounding structures were obtained without and with intravenous contrast. CONTRAST:  36mL GADAVIST GADOBUTROL 1 MMOL/ML IV  SOLN The patient experienced nausea and vomiting after contrast injection. COMPARISON:  [facility] head CT without contrast 02/21/2019. FINDINGS: Brain: Cerebral volume is within normal limits. Mild asymmetry of the occipital horns appears to be normal variation. No restricted diffusion to suggest acute infarction. No midline shift, mass effect, evidence of mass lesion, ventriculomegaly, extra-axial collection or acute intracranial hemorrhage. Cervicomedullary junction and pituitary are within normal limits. Thin slice coronal T2 and FLAIR images demonstrate no definite hippocampal asymmetry or signal abnormality. Pearline Cables and white matter signal is within normal limits throughout the brain. No chronic cerebral blood products. No abnormal enhancement identified. No dural thickening. Vascular: Major intracranial vascular flow voids are preserved. The right vertebral artery appears dominant. The major dural venous sinuses are enhancing and appear to be patent. Skull and upper cervical spine: Negative visible cervical  spine. Visualized bone marrow signal is within normal limits. Sinuses/Orbits: Negative orbits. Trace paranasal sinus mucosal thickening. Other: Mastoids are clear. Visible internal auditory structures appear normal. Scalp and face soft tissues appear negative. IMPRESSION: No acute intracranial abnormality. Normal MRI appearance of the brain. Electronically Signed   By: Genevie Ann M.D.   On: 02/22/2019 23:49    Cardiac Studies   Echo 1. Left ventricular ejection fraction, by visual estimation, is 40 to 45%. The left ventricle has moderately decreased function. There is no left ventricular hypertrophy. 2. Left ventricular diastolic parameters are indeterminate. 3. The left ventricle demonstrates global hypokinesis. 4. Global right ventricle has normal systolic function.The right ventricular size is normal. No increase in right ventricular wall thickness. 5. Left atrial size was normal. 6. Right atrial size was normal. 7. The mitral valve is normal in structure. Trace mitral valve regurgitation. 8. The tricuspid valve is normal in structure. Tricuspid valve regurgitation is trivial. 9. The aortic valve is tricuspid. Aortic valve regurgitation is not visualized. No evidence of aortic valve sclerosis or stenosis. 10. The pulmonic valve was grossly normal. Pulmonic valve regurgitation is not visualized. 11. TR signal is inadequate for assessing pulmonary artery systolic pressure. 12. The inferior vena cava is dilated in size with <50% respiratory variability, suggesting right atrial pressure of 15 mmHg. 13. Hypermobile linear mass in right atrium, appears consistent with Chiari. However, cannot exclude fibrin or thormbus based on this study.  Patient Profile     23 y.o. male with a hx of COVID positive test last Friday who is being seen today for the evaluation of syncope at the request of Dr. Lonny Prude.  Assessment & Plan    Syncope Patient presented with multiple episodes of syncope with  prodrome of tunnel vision - possibly vasovagal - CT head negative for acute process - Neurology following. 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 Possible outpatient follow-up for cardiac MRI.  - HR in the 50-60ss >> Would hold on BB at this time - MD to see   For questions or updates, please contact [facility] Please consult [email or web address] for contact info under        Signed, Cadence Ninfa Meeker, PA-C  02/23/2019, 9:31 AM    Personally seen and examined. Agree with above.   Tele normal, sinus brady. No pauses. He feels good, no  CP.  Likely etiology for syncope is vaso vagal.  He is going to continue with Keppra just in case seizure.  EF 45%. Will obtain cardiac MRI as outpatient. No Bb, BP does not allow ACE-I.  OK for DC from our perspective.   Donato Schultz, MD

## 2019-02-23 NOTE — Telephone Encounter (Signed)
Call cannot be completed using phone number on record [phone].

## 2019-02-25 NOTE — Discharge Summary (Signed)
Jesus Lopez LKG:[id number] DOB: 1995/06/18 DOA: 02/21/2019  PCP: No primary care provider on file.  Admit date: 02/21/2019 Discharge date: 02/25/2019  Admitted From:Home Disposition:  Home  Recommendations for Outpatient Follow-up:  1. Follow up with PCP in 1-2 weeks 2. Continue Keppra, Cardiology and Neurology follow up arranged 3. COVID precautions on discharge provided  Home Health:NO Equipment/Devices:none Discharge Condition: Stable CODE STATUS:FULL Diet recommendation: Heart Healthy  Brief/Interim Summary: History of present illness:  Jesus Lopez is a 23 y.o. year old male with medical history significant for recent COVID-19 diagnosis who presented to [facility] ED on 02/21/2019 with episode of loss of consciousness while sitting on couch at dinner, preceded by a blue light in his vision. He reports confusion without fecal/urine incontinence afterwards but did have mouth numbness ( and witnessed slurred speech per his girlfriend) as well as left-sided numbness (face and arm) afterwards that resolved in 15 minutes.   Reports previous episode 2 weeks ago after standing up from sex felt lightheaded and weak. He passed out and his girlfriend witnessed whole body convulsions. He again reported having mouth numbness and total body numbness as well as full body aches. No fecal/urine incontinence at that time.   On July 4 after 8 beers and liquor he had witnessed convulsions.  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 He states he was diagnosed with hypoglycemia by his PCP.  He has a glucometer but it hasn't been working lately. Reports gluc of 89 at [facility] prior to transfer     Transferred from [facility] ED due to concern for syncopal episode versus seizure-like activities and /or TIA.     Remaining hospital course addressed in problem based format  below:   Hospital Course:   Episodes of loss of consciousness in conjunction with presyncopal sensation followed by convulsive activity.  Work-up included negative CT head, UDS (obtained at [facility]), normal orthostatic vitals, negative EEG, nonacute MRI.  Differential includes possible convulsive syncope, no witnessed episodes during monitoring in the hospital.  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  New cardiomyopathy with EF 40-45%.  Found on TTE and work-up for above.  Compensated on exam telemetry monitoring showed mild bradycardia with heart rate in 50s to 60s. -Cardiology plans for outpatient cardiac MRI once Covid status resolved. -Given bradycardia, cardiology recommends holding on beta-blocker -We will follow with cardiology  Covid infection positive without hypoxia.  Mild symptoms included subjective fever, headache, abdominal pain.  Maintain normal oxygen saturation on room air.  Normal telemetry markers. -Continue vitamin C and zinc -Covid precautions provided on day of discharge.   Consultations:  Neurology, cardiology  Procedures/Studies: TTE, 11/16:  1. Left ventricular ejection fraction, by visual estimation, is 40 to 45%. The left ventricle has moderately decreased function. There is no left ventricular hypertrophy.  2. Left ventricular diastolic parameters are indeterminate.  3. The left ventricle demonstrates global hypokinesis.  4. Global right ventricle has normal systolic function.The right ventricular size is normal. No increase in right ventricular wall thickness.  5. Left atrial size was normal.  6. Right atrial size was normal.  7. The mitral valve is normal in structure. Trace mitral  valve regurgitation.  8. The tricuspid  valve is normal in structure. Tricuspid valve regurgitation is trivial.  9. The aortic valve is tricuspid. Aortic valve regurgitation is not visualized. No evidence of aortic valve sclerosis or stenosis. 10. The pulmonic valve was grossly normal. Pulmonic valve regurgitation is not visualized. 11. TR signal is inadequate for assessing pulmonary artery systolic pressure. 12. The inferior vena cava is dilated in size with <50% respiratory variability, suggesting right atrial pressure of 15 mmHg. 13. Hypermobile linear mass in right atrium, appears consistent with Chiari. However, cannot exclude fibrin or thormbus based on this study. Subjective:  Discharge Exam: Vitals:   02/22/19 0900 02/22/19 1600  BP: 119/73 122/72  Pulse: 62   Resp: 12 12  Temp: 98 F (36.7 C) 98.1 F (36.7 C)  SpO2:     Vitals:   02/21/19 2200 02/22/19 0031 02/22/19 0900 02/22/19 1600  BP: 122/75 120/65 119/73 122/72  Pulse: (!) 50 64 62   Resp: 20 12 12 12   Temp: 97.8 F (36.6 C) 97.6 F (36.4 C) 98 F (36.7 C) 98.1 F (36.7 C)  TempSrc: Oral Oral Oral Oral  SpO2: 100% 100%    Weight: 103.5 kg     Height: 6\' 2"  (1.88 m)       Young male, lying in bed in no distress Awake Alert, Oriented X 3, Normal affect Follows commands, 5/5 strengthh in bilateral upper and lower extremities, no appreciable focal deficits [place].AT, No JVD Symmetrical Chest wall movement, Good air movement bilaterally on room air, CTAB RRR,No Gallops,Rubs or new Murmurs,  +ve B.Sounds, Abd Soft, No tenderness,  No rebound, guarding or rigidity. No Cyanosis, Clubbing or edema, No new Rash or bruise   Discharge Diagnoses:  Active Problems:   COVID-19   Seizures (HCC)   Heart failure with reduced ejection fraction (HCC)   Loss of consciousness [facility])    Discharge Instructions  Discharge Instructions    MyChart COVID-19 home monitoring program   Complete by: Feb 23, 2019    Is the patient  willing to use the MyChart Mobile App for home monitoring?: Yes   Temperature monitoring   Complete by: Feb 23, 2019    After how many days would you like to receive a notification of this patient's flowsheet entries?: 1   Diet - low sodium heart healthy   Complete by: As directed    Increase activity slowly   Complete by: As directed      Allergies as of 02/23/2019   No Known Allergies     Medication List    STOP taking these medications   ibuprofen 800 MG tablet Commonly known as: ADVIL     TAKE these medications   ascorbic acid 500 MG tablet Commonly known as: VITAMIN C Take 1 tablet (500 mg total) by mouth daily.   levETIRAcetam 500 MG tablet Commonly known as: KEPPRA Take 1 tablet (500 mg total) by mouth 2 (two) times daily.   zinc sulfate 220 (50 Zn) MG capsule Take 1 capsule (220 mg total) by mouth daily.      Follow-up Information    Rosalio Macadamia, NP Follow up on 03/15/2019.   Specialties: Nurse Practitioner, Interventional Cardiology, Cardiology, Radiology Why: Please go to hospital follow up December 7th at 11:00 AM Contact information: [address] [place] [address] [phone]        [facility]. Schedule an appointment as soon as possible for a visit in 4 week(s).   Why: call and make an appointment  Contact information: [address]     [place] [id number] [phone]         No Known Allergies      The results of significant diagnostics from this hospitalization (including imaging, microbiology, ancillary and laboratory) are listed below for reference.     Microbiology: No results found for this or any previous visit (from the past 240 hour(s)).   Labs: BNP (last 3 results) No results for input(s): BNP in the last 8760 hours. Basic Metabolic Panel: Recent Labs  Lab 02/22/19 0106 02/23/19 0415  NA 138 142  K 4.2 4.0  CL 103 105  CO2 26 26  GLUCOSE  95 90  BUN 11 15  CREATININE 0.96 1.02  CALCIUM 9.0 9.1   Liver Function Tests: Recent Labs  Lab 02/22/19 0106  AST 16  ALT 16  ALKPHOS 49  BILITOT 0.7  PROT 6.5  ALBUMIN 4.0   No results for input(s): LIPASE, AMYLASE in the last 168 hours. No results for input(s): AMMONIA in the last 168 hours. CBC: Recent Labs  Lab 02/22/19 0106 02/23/19 0415  WBC 6.2 7.2  NEUTROABS 3.7  --   HGB 15.3 15.8  HCT 45.2 45.4  MCV 85.3 84.2  PLT 175 172   Cardiac Enzymes: No results for input(s): CKTOTAL, CKMB, CKMBINDEX, TROPONINI in the last 168 hours. BNP: Invalid input(s): POCBNP CBG: No results for input(s): GLUCAP in the last 168 hours. D-Dimer No results for input(s): DDIMER in the last 72 hours. Hgb A1c No results for input(s): HGBA1C in the last 72 hours. Lipid Profile No results for input(s): CHOL, HDL, LDLCALC, TRIG, CHOLHDL, LDLDIRECT in the last 72 hours. Thyroid function studies No results for input(s): TSH, T4TOTAL, T3FREE, THYROIDAB in the last 72 hours.  Invalid input(s): FREET3 Anemia work up No results for input(s): VITAMINB12, FOLATE, FERRITIN, TIBC, IRON, RETICCTPCT in the last 72 hours. Urinalysis No results found for: COLORURINE, APPEARANCEUR, LABSPEC, [place], GLUCOSEU, HGBUR, BILIRUBINUR, KETONESUR, PROTEINUR, UROBILINOGEN, NITRITE, LEUKOCYTESUR Sepsis Labs Invalid input(s): PROCALCITONIN,  WBC,  LACTICIDVEN Microbiology No results found for this or any previous visit (from the past 240 hour(s)).   Time coordinating discharge: Over 30 minutes  SIGNED:   Desiree Hane, MD  [facility] 02/25/2019, 1:28 PM Pager   If 7PM-7AM, please contact night-coverage [email or web address] Password [email or web address]

## 2019-03-10 ENCOUNTER — Ambulatory Visit (INDEPENDENT_AMBULATORY_CARE_PROVIDER_SITE_OTHER): Payer: Federal, State, Local not specified - PPO

## 2019-03-10 ENCOUNTER — Other Ambulatory Visit: Payer: Self-pay

## 2019-03-10 ENCOUNTER — Telehealth: Payer: Self-pay

## 2019-03-10 DIAGNOSIS — R55 Syncope and collapse: Secondary | ICD-10-CM

## 2019-03-10 NOTE — Telephone Encounter (Signed)
S/w pt to see if pt had event monitor on due to upcoming appt with Truitt Merle, NP on Monday, December 7.  Pt has never received monitor.  Looking through pt's chart the wrong phone number was called. Pt asked if can come in office today to get monitor placed on.  S/w April Louretta Shorten and Janan Halter and stated pt can get monitor put on today in office. Pt recently tested positive for Covid but has been cleared to go back to work.  Pt is calling back due to license has been taken away from pt due to seizures and has to get a ride to make sure pt can keep monitor appt today.  Pt has been added on to monitor schedule and has been sent to precert.  Pt's  upcoming appt has been R/S to see Dr. Marlou Porch in 6 weeks whom pt seen in the hospital.  Will confirm appt with pt when pt calls back.

## 2019-03-10 NOTE — Telephone Encounter (Signed)
30 day Event monitor placed on pt in office today.   [id number]

## 2019-03-11 NOTE — Progress Notes (Deleted)
CARDIOLOGY OFFICE NOTE  Date:  03/11/2019    Jesus Lopez Date of Birth: January 05, 1996 Medical Record #[id number]  PCP:  Dulce Sellar, NP  Cardiologist:  Tyrone Sage & ***    No chief complaint on file.   History of Present Illness: Jesus Lopez is a 23 y.o. male who presents today for a ***  The patient {does/does not:200015} have symptoms concerning for COVID-19 infection (fever, chills, cough, or new shortness of breath).   Comes in today. Here with   Past Medical History:  Diagnosis Date  . COVID-19   . Hypoglycemia   . Syncope     No past surgical history on file.   Medications: No outpatient medications have been marked as taking for the 03/15/19 encounter (Appointment) with Rosalio Macadamia, NP.     Allergies: No Known Allergies  Social History: The patient  reports that he quit smoking about 4 weeks ago. His smoking use included cigarettes. He has never used smokeless tobacco. He reports current alcohol use. He reports that he does not use drugs.   Family History: The patient's ***family history includes Arrhythmia in his father; Hypertension in his father and mother.   Review of Systems: Please see the history of present illness.   All other systems are reviewed and negative.   Physical Exam: VS:  There were no vitals taken for this visit. Marland Kitchen  BMI There is no height or weight on file to calculate BMI.  Wt Readings from Last 3 Encounters:  02/21/19 228 lb 2.8 oz (103.5 kg)    General: Pleasant. Well developed, well nourished and in no acute distress.   HEENT: Normal.  Neck: Supple, no JVD, carotid bruits, or masses noted.  Cardiac: ***Regular rate and rhythm. No murmurs, rubs, or gallops. No edema.  Respiratory:  Lungs are clear to auscultation bilaterally with normal work of breathing.  GI: Soft and nontender.  MS: No deformity or atrophy. Gait and ROM intact.  Skin: Warm and dry. Color is normal.  Neuro:  Strength and sensation are  intact and no gross focal deficits noted.  Psych: Alert, appropriate and with normal affect.   LABORATORY DATA:  EKG:  EKG {ACTION; IS/IS [id number]} ordered today. This demonstrates ***.  Lab Results  Component Value Date   WBC 7.2 02/23/2019   HGB 15.8 02/23/2019   HCT 45.4 02/23/2019   PLT 172 02/23/2019   GLUCOSE 90 02/23/2019   ALT 16 02/22/2019   AST 16 02/22/2019   NA 142 02/23/2019   K 4.0 02/23/2019   CL 105 02/23/2019   CREATININE 1.02 02/23/2019   BUN 15 02/23/2019   CO2 26 02/23/2019   TSH 1.765 02/22/2019     BNP (last 3 results) No results for input(s): BNP in the last 8760 hours.  ProBNP (last 3 results) No results for input(s): PROBNP in the last 8760 hours.   Other Studies Reviewed Today:   Assessment/Plan:  . COVID-19 Education: The signs and symptoms of COVID-19 were discussed with the patient and how to seek care for testing (follow up with PCP or arrange E-visit).  The importance of social distancing, staying at home, hand hygiene and wearing a mask when out in public were discussed today.  Current medicines are reviewed with the patient today.  The patient does not have concerns regarding medicines other than what has been noted above.  The following changes have been made:  See above.  Labs/ tests ordered today include:   No  orders of the defined types were placed in this encounter.    Disposition:   FU with *** in {gen number 5-01:586825} {Days to years:10300}.   Patient is agreeable to this plan and will call if any problems develop in the interim.   SignedTruitt Merle, NP  03/11/2019 7:17 AM  [facility] [address] [name], [place]  [address] Phone: [phone] Fax: [phone]

## 2019-03-15 ENCOUNTER — Ambulatory Visit: Payer: Self-pay | Admitting: Nurse Practitioner

## 2019-04-23 ENCOUNTER — Encounter: Payer: Self-pay | Admitting: Cardiology

## 2019-04-23 ENCOUNTER — Ambulatory Visit (INDEPENDENT_AMBULATORY_CARE_PROVIDER_SITE_OTHER): Payer: Federal, State, Local not specified - PPO | Admitting: Cardiology

## 2019-04-23 ENCOUNTER — Other Ambulatory Visit: Payer: Self-pay

## 2019-04-23 VITALS — BP 120/66 | HR 80 | Ht 74.0 in | Wt 227.0 lb

## 2019-04-23 DIAGNOSIS — R55 Syncope and collapse: Secondary | ICD-10-CM

## 2019-04-23 DIAGNOSIS — Z01812 Encounter for preprocedural laboratory examination: Secondary | ICD-10-CM | POA: Diagnosis not present

## 2019-04-23 DIAGNOSIS — I429 Cardiomyopathy, unspecified: Secondary | ICD-10-CM

## 2019-04-23 DIAGNOSIS — I5020 Unspecified systolic (congestive) heart failure: Secondary | ICD-10-CM

## 2019-04-23 NOTE — Progress Notes (Signed)
Cardiology Office Note:    Date:  04/23/2019   ID:  HIRO VIPOND, DOB 1995/09/16, MRN [id number]  PCP:  Dulce Sellar, NP  Cardiologist:  Donato Schultz, MD  Electrophysiologist:  None   Referring MD: No ref. provider found     History of Present Illness:    Jesus Lopez is a 24 y.o. male with prior COVID-19 infection in November 2020, prior license had been taken away due to seizures, I saw him in hospital  Telemetry in the hospital reviewed showed heart rates 50s to 60s bradycardia at night low as 39 but this is benign in the setting.  Echocardiogram reviewed showed the following: Echo 1. Left ventricular ejection fraction, by visual estimation, is 40 to 45%. The left ventricle has moderately decreased function. There is no left ventricular hypertrophy. 2. Left ventricular diastolic parameters are indeterminate. 3. The left ventricle demonstrates global hypokinesis. 4. Global right ventricle has normal systolic function.The right ventricular size is normal. No increase in right ventricular wall thickness. 5. Left atrial size was normal. 6. Right atrial size was normal. 7. The mitral valve is normal in structure. Trace mitral valve regurgitation. 8. The tricuspid valve is normal in structure. Tricuspid valve regurgitation is trivial. 9. The aortic valve is tricuspid. Aortic valve regurgitation is not visualized. No evidence of aortic valve sclerosis or stenosis. 10. The pulmonic valve was grossly normal. Pulmonic valve regurgitation is not visualized. 11. TR signal is inadequate for assessing pulmonary artery systolic pressure. 12. The inferior vena cava is dilated in size with <50% respiratory variability, suggesting right atrial pressure of 15 mmHg. 13. Hypermobile linear mass in right atrium, appears consistent with Chiari. However, cannot exclude fibrin or thormbus based on this study.   Feels SOB with physical activity, hard to catch her breath.  Living will  value would, severely tired, short winded.  Having some numbness in his arms hands.  Extreme fatigue.  Laying in bed, no chest wall throb, hard heartbeats.  Off seizure meds. If stand to long legs like Jello. Been dealing with this before COVID, over a year and a half. Not diabetes. Feet purple when sit, cold feet.  Discussed echocardiogram with him.  Very frustrated with his symptoms.  Past Medical History:  Diagnosis Date  . COVID-19   . Hypoglycemia   . Syncope     History reviewed. No pertinent surgical history.  Current Medications: No outpatient medications have been marked as taking for the 04/23/19 encounter (Office Visit) with Jake Bathe, MD.     Allergies:   Patient has no known allergies.   Social History   Socioeconomic History  . Marital status: Single    Spouse name: Not on file  . Number of children: Not on file  . Years of education: Not on file  . Highest education level: Not on file  Occupational History  . Not on file  Tobacco Use  . Smoking status: Former Smoker    Types: Cigarettes    Quit date: 02/07/2019    Years since quitting: 0.2  . Smokeless tobacco: Never Used  Substance and Sexual Activity  . Alcohol use: Yes  . Drug use: Never  . Sexual activity: Yes  Other Topics Concern  . Not on file  Social History Narrative  . Not on file   Social Determinants of Health   Financial Resource Strain: Low Risk   . Difficulty of Paying Living Expenses: Not very hard  Food Insecurity: No Food Insecurity  .  Worried About Charity fundraiser in the Last Year: Never true  . Ran Out of Food in the Last Year: Never true  Transportation Needs: No Transportation Needs  . Lack of Transportation (Medical): No  . Lack of Transportation (Non-Medical): No  Physical Activity: Unknown  . Days of Exercise per Week: Patient refused  . Minutes of Exercise per Session: Patient refused  Stress: Stress Concern Present  . Feeling of Stress : To some extent  Social  Connections: Unknown  . Frequency of Communication with Friends and Family: Patient refused  . Frequency of Social Gatherings with Friends and Family: Patient refused  . Attends Religious Services: Patient refused  . Active Member of Clubs or Organizations: Patient refused  . Attends Archivist Meetings: Patient refused  . Marital Status: Patient refused     Family History: The patient's family history includes Arrhythmia in his father; Hypertension in his father and mother.  ROS:   Please see the history of present illness.    No fevers chills.  Mild residual cough.  All other systems reviewed and are negative.  EKGs/Labs/Other Studies Reviewed:    The following studies were reviewed today: Hospital records and echocardiogram reviewed  EKG: Not ordered today  Recent Labs: 02/22/2019: ALT 16; TSH 1.765 02/23/2019: BUN 15; Creatinine, Ser 1.02; Hemoglobin 15.8; Platelets 172; Potassium 4.0; Sodium 142  Recent Lipid Panel No results found for: CHOL, TRIG, HDL, CHOLHDL, VLDL, LDLCALC, LDLDIRECT  Physical Exam:    VS:  BP 120/66   Pulse 80   Ht 6\' 2"  (1.88 m)   Wt 227 lb (103 kg)   SpO2 98%   BMI 29.15 kg/m     Wt Readings from Last 3 Encounters:  04/23/19 227 lb (103 kg)  02/21/19 228 lb 2.8 oz (103.5 kg)     GEN:  Well nourished, well developed in no acute distress HEENT: Normal NECK: No JVD; No carotid bruits LYMPHATICS: No lymphadenopathy CARDIAC: RRR, no murmurs, rubs, gallops RESPIRATORY:  Clear to auscultation without rales, wheezing or rhonchi  ABDOMEN: Soft, non-tender, non-distended MUSCULOSKELETAL:  No edema; No deformity  SKIN: Warm and dry NEUROLOGIC:  Alert and oriented x 3 PSYCHIATRIC:  Normal affect   ASSESSMENT:    1. Heart failure with reduced ejection fraction (HCC)   2. Cardiomyopathy, unspecified type ([place])   3. Pre-procedure lab exam   4. Syncope and collapse    PLAN:    In order of problems listed  above:  Syncope -Multiple prior episodes of syncope with prodrome of tunnel vision possibly vasovagal.  He gets some mild nausea, clammy skin, sweating.  CT of the head was negative for any acute process neurology showed EEG negative for seizure activity and brain MRI was otherwise normal.  Echocardiogram demonstrated EF of 40 to 45% trace MR trivial TR.  Telemetry showed sinus rhythm in the hospital orthostatics were normal.  TSH was normal.  Obviously frustrated with the way he has been feeling over the past year.  Encouraged salt liberalization and fluid intake.  Cardiac event monitor on 04/20/2019:  Sinus rhythm with average HR 75 bpm  No atrial fibrillation, no adverse arrhythmias  Occasional premature atrial complexes (symptom of fluttering noted).  Sinus bradycardia noted at night (40-50's) benign.   Reassuring monitor Candee Furbish, MD  Cardiomyopathy -EF was 40 to 45% in the hospital setting in November surrounding Covid.  No beta-blocker. -We will check cardiac MRI -We will check exercise treadmill test since he has the symptoms  when exerting himself. -Currently on no medications, blood pressure 120/66 currently.  -We will have him come back in in 1 month.   Medication Adjustments/Labs and Tests Ordered: Current medicines are reviewed at length with the patient today.  Concerns regarding medicines are outlined above.  Orders Placed This Encounter  Procedures  . MR Card Morphology Wo/W Cm  . Basic Metabolic Panel (BMET)  . Exercise Tolerance Test   No orders of the defined types were placed in this encounter.   Patient Instructions  Medication Instructions:  The current medical regimen is effective;  continue present plan and medications.  *If you need a refill on your cardiac medications before your next appointment, please call your pharmacy*  Lab Work: If you be scheduled for blood work before your Cardiac MRI if needed. (BMP) If you have labs (blood work) drawn  today and your tests are completely normal, you will receive your results only by: Marland Kitchen MyChart Message (if you have MyChart) OR . A paper copy in the mail If you have any lab test that is abnormal or we need to change your treatment, we will call you to review the results.  Testing/Procedures: Your physician has requested that you have an exercise tolerance test. For further information please visit [email or web address] Please also follow instruction sheet, as given.  Your physician has requested that you have a cardiac MRI. Cardiac MRI uses a computer to create images of your heart as its beating, producing both still and moving pictures of your heart and major blood vessels. For further information please visit [email or web address].   Follow-Up: At [facility], you and your health needs are our priority.  As part of our continuing mission to provide you with exceptional heart care, we have created designated Provider Care Teams.  These Care Teams include your primary Cardiologist (physician) and Advanced Practice Providers (APPs -  Physician Assistants and Nurse Practitioners) who all work together to provide you with the care you need, when you need it.  Your next appointment:   1 month(s)  The format for your next appointment:   In Person  Provider:   Donato Schultz, MD  Thank you for choosing [facility]!!        Signed, Donato Schultz, MD  04/23/2019 10:32 AM    [facility]

## 2019-04-23 NOTE — Patient Instructions (Signed)
Medication Instructions:  The current medical regimen is effective;  continue present plan and medications.  *If you need a refill on your cardiac medications before your next appointment, please call your pharmacy*  Lab Work: If you be scheduled for blood work before your Cardiac MRI if needed. (BMP) If you have labs (blood work) drawn today and your tests are completely normal, you will receive your results only by: Marland Kitchen MyChart Message (if you have MyChart) OR . A paper copy in the mail If you have any lab test that is abnormal or we need to change your treatment, we will call you to review the results.  Testing/Procedures: Your physician has requested that you have an exercise tolerance test. For further information please visit [email or web address] Please also follow instruction sheet, as given.  Your physician has requested that you have a cardiac MRI. Cardiac MRI uses a computer to create images of your heart as its beating, producing both still and moving pictures of your heart and major blood vessels. For further information please visit [email or web address].   Follow-Up: At [facility], you and your health needs are our priority.  As part of our continuing mission to provide you with exceptional heart care, we have created designated Provider Care Teams.  These Care Teams include your primary Cardiologist (physician) and Advanced Practice Providers (APPs -  Physician Assistants and Nurse Practitioners) who all work together to provide you with the care you need, when you need it.  Your next appointment:   1 month(s)  The format for your next appointment:   In Person  Provider:   Donato Schultz, MD  Thank you for choosing [facility]!!

## 2019-04-30 ENCOUNTER — Other Ambulatory Visit (HOSPITAL_COMMUNITY)
Admission: RE | Admit: 2019-04-30 | Discharge: 2019-04-30 | Disposition: A | Discharge status: Home / Self Care | Payer: Federal, State, Local not specified - PPO | Source: Ambulatory Visit | Attending: Cardiology | Admitting: Cardiology

## 2019-04-30 ENCOUNTER — Telehealth: Payer: Self-pay | Admitting: Cardiology

## 2019-04-30 ENCOUNTER — Encounter: Payer: Self-pay | Admitting: Cardiology

## 2019-04-30 DIAGNOSIS — Z20822 Contact with and (suspected) exposure to covid-19: Secondary | ICD-10-CM | POA: Diagnosis not present

## 2019-04-30 DIAGNOSIS — Z01812 Encounter for preprocedural laboratory examination: Secondary | ICD-10-CM | POA: Diagnosis not present

## 2019-04-30 LAB — SARS CORONAVIRUS 2 (TAT 6-24 HRS): SARS Coronavirus 2: NEGATIVE

## 2019-04-30 NOTE — Telephone Encounter (Signed)
Spoke with patient regarding cardiac mri appointment scheduled 05/12/19 at 1:00 pm at [facility]--arrival time is 12:15pm 1st floor admissions office---will mail information to patient and it is also in My Chart

## 2019-05-04 ENCOUNTER — Ambulatory Visit (INDEPENDENT_AMBULATORY_CARE_PROVIDER_SITE_OTHER): Payer: Federal, State, Local not specified - PPO

## 2019-05-04 ENCOUNTER — Other Ambulatory Visit: Payer: Self-pay

## 2019-05-04 DIAGNOSIS — I429 Cardiomyopathy, unspecified: Secondary | ICD-10-CM

## 2019-05-04 DIAGNOSIS — I5020 Unspecified systolic (congestive) heart failure: Secondary | ICD-10-CM

## 2019-05-04 LAB — EXERCISE TOLERANCE TEST
Estimated workload: 17 METS
Exercise duration (min): 9 min
Exercise duration (sec): 58 s
MPHR: 196 {beats}/min
Peak HR: 155 {beats}/min
Percent HR: 79 %
RPE: 17
Rest HR: 71 {beats}/min

## 2019-05-05 ENCOUNTER — Telehealth: Payer: Self-pay

## 2019-05-05 NOTE — Telephone Encounter (Signed)
-----   Message from Jake Bathe, MD sent at 05/05/2019 12:53 PM EST ----- Overall reassuring exercise treadmill test with normal blood pressure response.  No evidence of ischemia. Donato Schultz, MD

## 2019-05-05 NOTE — Telephone Encounter (Signed)
lpmtcb 1/27 

## 2019-05-06 ENCOUNTER — Telehealth: Payer: Self-pay

## 2019-05-06 NOTE — Telephone Encounter (Signed)
The patient has been notified of the stress test result and verbalized understanding.  All questions (if any) were answered. Raiford Fetterman, RN 05/06/2019 8:53 AM

## 2019-05-06 NOTE — Telephone Encounter (Signed)
-----   Message from Mark C Skains, MD sent at 05/05/2019 12:53 PM EST ----- Overall reassuring exercise treadmill test with normal blood pressure response.  No evidence of ischemia. Mark Skains, MD  

## 2019-05-11 ENCOUNTER — Telehealth (HOSPITAL_COMMUNITY): Payer: Self-pay | Admitting: Emergency Medicine

## 2019-05-11 ENCOUNTER — Encounter (HOSPITAL_COMMUNITY): Payer: Self-pay

## 2019-05-11 NOTE — Telephone Encounter (Signed)
Reaching out to patient to offer assistance regarding upcoming cardiac imaging study; pt verbalizes understanding of appt date/time, parking situation and where to check in,and verified current allergies; name and call back number provided for further questions should they arise Rockwell Alexandria RN Navigator Cardiac Imaging [facility] [phone] office [phone] cell  Pt denies metal implants, shrapnel, claustrophobia, denies covid symptoms

## 2019-05-12 ENCOUNTER — Telehealth: Payer: Self-pay | Admitting: Cardiology

## 2019-05-12 ENCOUNTER — Ambulatory Visit (HOSPITAL_COMMUNITY)
Admission: RE | Admit: 2019-05-12 | Discharge: 2019-05-12 | Disposition: A | Discharge status: Home / Self Care | Payer: Federal, State, Local not specified - PPO | Source: Ambulatory Visit | Attending: Cardiology | Admitting: Cardiology

## 2019-05-12 ENCOUNTER — Other Ambulatory Visit: Payer: Self-pay

## 2019-05-12 ENCOUNTER — Emergency Department (HOSPITAL_COMMUNITY)
Admission: EM | Admit: 2019-05-12 | Discharge: 2019-05-12 | Disposition: A | Discharge status: Home / Self Care | Payer: Federal, State, Local not specified - PPO | Attending: Emergency Medicine | Admitting: Emergency Medicine

## 2019-05-12 DIAGNOSIS — Z87891 Personal history of nicotine dependence: Secondary | ICD-10-CM | POA: Insufficient documentation

## 2019-05-12 DIAGNOSIS — T782XXA Anaphylactic shock, unspecified, initial encounter: Secondary | ICD-10-CM | POA: Diagnosis not present

## 2019-05-12 DIAGNOSIS — I429 Cardiomyopathy, unspecified: Secondary | ICD-10-CM | POA: Insufficient documentation

## 2019-05-12 DIAGNOSIS — Z8616 Personal history of COVID-19: Secondary | ICD-10-CM | POA: Diagnosis not present

## 2019-05-12 DIAGNOSIS — I959 Hypotension, unspecified: Secondary | ICD-10-CM | POA: Diagnosis not present

## 2019-05-12 DIAGNOSIS — I5020 Unspecified systolic (congestive) heart failure: Secondary | ICD-10-CM

## 2019-05-12 DIAGNOSIS — R0789 Other chest pain: Secondary | ICD-10-CM | POA: Diagnosis not present

## 2019-05-12 LAB — BASIC METABOLIC PANEL
Anion gap: 8 (ref 5–15)
BUN: 14 mg/dL (ref 6–20)
CO2: 24 mmol/L (ref 22–32)
Calcium: 8.9 mg/dL (ref 8.9–10.3)
Chloride: 106 mmol/L (ref 98–111)
Creatinine, Ser: 0.95 mg/dL (ref 0.61–1.24)
GFR calc Af Amer: >60 mL/min (ref >60)
GFR calc non Af Amer: >60 mL/min (ref >60)
Glucose, Bld: 106 mg/dL — ABNORMAL HIGH (ref 70–99)
Potassium: 3.2 mmol/L — ABNORMAL LOW (ref 3.5–5.1)
Sodium: 138 mmol/L (ref 135–145)

## 2019-05-12 LAB — CBC WITH DIFFERENTIAL/PLATELET
Abs Immature Granulocytes: 0.04 10*3/uL (ref 0.00–0.07)
Basophils Absolute: 0 10*3/uL (ref 0.0–0.1)
Basophils Relative: 0 %
Eosinophils Absolute: 0.1 10*3/uL (ref 0.0–0.5)
Eosinophils Relative: 1 %
HCT: 44.1 % (ref 39.0–52.0)
Hemoglobin: 15.2 g/dL (ref 13.0–17.0)
Immature Granulocytes: 0 %
Lymphocytes Relative: 37 %
Lymphs Abs: 4.2 10*3/uL — ABNORMAL HIGH (ref 0.7–4.0)
MCH: 29 pg (ref 26.0–34.0)
MCHC: 34.5 g/dL (ref 30.0–36.0)
MCV: 84.2 fL (ref 80.0–100.0)
Monocytes Absolute: 1 10*3/uL (ref 0.1–1.0)
Monocytes Relative: 9 %
Neutro Abs: 5.9 10*3/uL (ref 1.7–7.7)
Neutrophils Relative %: 53 %
Platelets: 232 10*3/uL (ref 150–400)
RBC: 5.24 MIL/uL (ref 4.22–5.81)
RDW: 11.9 % (ref 11.5–15.5)
WBC: 11.3 10*3/uL — ABNORMAL HIGH (ref 4.0–10.5)
nRBC: 0 % (ref 0.0–0.2)

## 2019-05-12 MED ORDER — DIPHENHYDRAMINE HCL 50 MG/ML IJ SOLN
INTRAMUSCULAR | Status: AC
  Filled 2019-05-12: qty 1

## 2019-05-12 MED ORDER — PREDNISONE 50 MG PO TABS: 50.0000 mg | ORAL_TABLET | Freq: Every day | ORAL | 0 refills | Status: AC

## 2019-05-12 MED ORDER — EPINEPHRINE 0.3 MG/0.3ML IJ SOAJ: 0.3000 mg | INTRAMUSCULAR | 1 refills | Status: DC | PRN

## 2019-05-12 MED ORDER — METHYLPREDNISOLONE SODIUM SUCC 125 MG IJ SOLR
125.0000 mg | INTRAMUSCULAR | Status: DC
  Filled 2019-05-12: qty 2

## 2019-05-12 MED ORDER — GADOBUTROL 1 MMOL/ML IV SOLN: 12.0000 mL | Freq: Once | INTRAVENOUS | Status: DC | PRN

## 2019-05-12 MED ORDER — SODIUM CHLORIDE 0.9 % IV BOLUS
1000.0000 mL | Freq: Once | INTRAVENOUS | Status: AC
Start: 2019-05-12 — End: 2019-05-12
  Administered 2019-05-12: 1000 mL via INTRAVENOUS

## 2019-05-12 MED ORDER — SODIUM CHLORIDE 0.9 % IV SOLN
INTRAVENOUS | Status: DC
Start: 2019-05-12 — End: 2019-05-13

## 2019-05-12 MED ORDER — EPINEPHRINE 0.3 MG/0.3ML IJ SOAJ
INTRAMUSCULAR | Status: AC
  Filled 2019-05-12: qty 0.3

## 2019-05-12 MED ORDER — FAMOTIDINE IN NACL 20-0.9 MG/50ML-% IV SOLN
20.0000 mg | INTRAVENOUS | Status: DC
  Filled 2019-05-12: qty 50

## 2019-05-12 MED ORDER — DIPHENHYDRAMINE HCL 25 MG PO TABS: 25.0000 mg | ORAL_TABLET | Freq: Three times a day (TID) | ORAL | 0 refills | Status: DC | PRN

## 2019-05-12 NOTE — ED Triage Notes (Signed)
Pt came in for outpatient MRI. Had allergic reaction to contrast. Pt given 2 epi shots, benedryl, solu-medrol, and pepsid in route from MRI to ED. VSS. Airway maintained

## 2019-05-12 NOTE — Code Documentation (Signed)
Rapid Response Team Event Note - Code Documentation  I responded to MRI for an RRT. Upon arrival, MRI staff informed that patient was here for a Cardiac MRI, he was given Gadolinium contrast for the MRI and he immediately felt the dye go in his IV. Per patient and staff, patient immediately felt short of breath, he was dusky/gray when staff pulled him out of the scanner. His face was flushed, he developed urticaria, and had tingling all over. SBP was in the 80s when I arrived. He was alert and oriented, 100% on RA - placed on 4L [place] for comfort. I did not see any tongue swelling and he denied throat swelling. The Radiologist came as well, I called a Code Blue for assistance, I started a NS bolus, SBP improved to low 100s, HR remained 70-80s, patient did endorse some chest tightness as well - I needed immediate support for the treatment of the patient. We quickly transferred the patient into the holding bay, I administered an EPI PEN in the LT thigh and Benadryl 25 mg IV. Code Team arrived, we gave another dose of EPI PEN in the RT thigh, and we decided to give Pepcid 20 mg IV and Solu Medrol 125 mg IV as well. Patient's SBP remained above 100, HR > 70, and sats 100% on 4L [place]. We transported the patient to the ED for further monitoring and evaluation. He was still having the tingling sensation down his arms, no focal neuro deficit appreciated, and he still had urticaria.    Patient did NOT lose a pulse and did NOT stop breathing. Code Blue was called for logistical support   Interventions: -- EPIPEN X 2 -- Benadryl 25 mg IV  -- Pepcid 20 mg IV  -- Solu-Medrol 125 mg IV   Taken to ED RM 30
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
## 2019-05-12 NOTE — Telephone Encounter (Signed)
New Message  Pt c/o medication issue:  1. Name of Medication: Gadolinium Contrast (MRI department)  2. How are you currently taking this medication (dosage and times per day)? N/A  3. Are you having a reaction (difficulty breathing--STAT)? Yes, MRI tech report code had to be called on him.   4. What is your medication issue? Patient was getting an MRI done and had a severe reaction. Patient was coded. MRI tech calling to report to Dr. Anne Fu.

## 2019-05-12 NOTE — ED Provider Notes (Signed)
  Physical Exam  BP (!) 135/57   Pulse 75   Temp 98.9 F (37.2 C) (Oral)   Resp (!) 24   Ht 6\' 2"  (1.88 m)   Wt 103 kg   SpO2 98%   BMI 29.15 kg/m   Physical Exam  ED Course/Procedures     Procedures  MDM  Patient care assumed from Britni H. PA at shift change, please see her note for a full HPI. Briefly, patient who presented as a rapid response alert after developing anaphylaxis while getting a cardiac MRI after contrast dye was administered.  Patient became hypotensive, urticaria, short of breath, sensation of his throat closing.  While his arrival into the ED patient had received epi pen x2, IV Benadryl 25 mg, the Medrol 125, Pepcid 20 mg.  Basic lab orders were order, remarkable for some mild hypokalemia.  Creatinine level is within normal limits.  CBC with an elevated leukocytosis, suspect this is likely reactive from   07:00PM patient was reevaluated by me, he appears stable for discharge.  Oropharynx appears erythematous but no swelling present.  Phonation is appropriate.  Lungs are clear to auscultation, you urticaria has resolved.  We discussed medications that he will need to go home on.  He is to obtain an EpiPen from pharmacy along with continue taking his medications that were prescribed.  Patient understands and agrees with management, discharged from the ED in stable condition.  Portions of this note were generated with . Dictation errors may occur despite best attempts at proofreading.        Scientist, clinical (histocompatibility and immunogenetics), PA-C 05/12/19 1905    Little, 07/10/19, MD 05/13/19 [phone]

## 2019-05-12 NOTE — Telephone Encounter (Signed)
Pt had reaction in MRI to the contrast, hives chest pressure, N/V. MRI technician states pt has now been transferred to the ED for further evaluation.

## 2019-05-12 NOTE — Discharge Instructions (Signed)
Take medications as prescribed.  If you have any facial swelling, sensation of throat closing, chest pain, shortness of breath please seek reevaluation emergency department.  Make sure to follow-up with cardiology for additional testing given your not able to have the MRI today.

## 2019-05-12 NOTE — Code Documentation (Signed)
CODE BLUE NOTE  Patient Name: Jesus Lopez   MRN: [id number]   Date of Birth/ Sex: [birth year] , male      Admission Date: 05/12/2019  Attending Provider: Melene Plan, DO  Primary Diagnosis: <principal problem not specified>    Indication: Pt was in his usual state of health until this 215 PM when he was given contrast for an MRI. He developed shortness of breath and became hypotensive. A code blue was subsequently called. At the time of arrival on scene, pt was alert and talking.  He was given Epi pen x2, IV Benadryl 25 mg x1, Solumedrol 125mg  x1 and Pepcid 20mg  x1.    Technical Description:  - CPR performance duration:  0 minute  - Was defibrillation or cardioversion used? No   - Was external pacer placed? No  - Was patient intubated pre/post CPR? No    Medications Administered: Y = Yes; Blank = No Amiodarone  N  Atropine  N  Calcium  N  Epinephrine  Y  Lidocaine  N  Magnesium  N  Norepinephrine  N  Phenylephrine  N  Sodium bicarbonate  N  Vasopressin  N    Post CPR evaluation:  - Final Status - Was patient successfully resuscitated ? Yes - What is current rhythm? SR - What is current hemodynamic status? stable   Miscellaneous Information:  - Labs sent, including: No  - Primary team notified?  Cardiology notified  - Family Notified? No  - Additional notes/ transfer status: Transferred to ED for observation        , MD  05/12/2019, 3:01 PM

## 2019-05-13 MED FILL — Medication: Qty: 1 | Status: AC

## 2019-05-13 NOTE — Telephone Encounter (Signed)
Thank you for update.  Sarah wellness with CT navigation let me know yesterday appreciate everyone's excellent care during this experience. Donato Schultz, MD

## 2019-05-14 ENCOUNTER — Telehealth: Payer: Self-pay

## 2019-05-14 NOTE — Telephone Encounter (Signed)
-----   Message from Jake Bathe, MD sent at 05/14/2019  1:36 PM EST ----- MRI overall reassuring with low normal ejection fraction 51%, normal cardiac output and no evidence of infarct pattern.  Had gadolinium reaction.  Stabilized in emergency department.  Donato Schultz, MD]

## 2019-05-14 NOTE — Telephone Encounter (Signed)
The patient has been notified of the result and verbalized understanding.  All questions (if any) were answered. Leanord Hawking, RN 05/14/2019 1:47 PM

## 2019-05-25 ENCOUNTER — Ambulatory Visit: Payer: Federal, State, Local not specified - PPO | Admitting: Cardiology

## 2019-05-26 ENCOUNTER — Ambulatory Visit: Payer: Federal, State, Local not specified - PPO | Admitting: Cardiology

## 2019-05-26 ENCOUNTER — Encounter: Payer: Self-pay | Admitting: *Deleted

## 2019-06-15 DIAGNOSIS — Z20828 Contact with and (suspected) exposure to other viral communicable diseases: Secondary | ICD-10-CM | POA: Diagnosis not present

## 2019-08-31 DIAGNOSIS — J029 Acute pharyngitis, unspecified: Secondary | ICD-10-CM | POA: Diagnosis not present

## 2019-08-31 DIAGNOSIS — J0100 Acute maxillary sinusitis, unspecified: Secondary | ICD-10-CM | POA: Diagnosis not present

## 2019-10-07 DIAGNOSIS — E079 Disorder of thyroid, unspecified: Secondary | ICD-10-CM | POA: Diagnosis not present

## 2019-10-07 DIAGNOSIS — I429 Cardiomyopathy, unspecified: Secondary | ICD-10-CM | POA: Diagnosis not present

## 2019-10-07 DIAGNOSIS — R55 Syncope and collapse: Secondary | ICD-10-CM | POA: Diagnosis not present

## 2019-10-07 DIAGNOSIS — Z6830 Body mass index (BMI) 30.0-30.9, adult: Secondary | ICD-10-CM | POA: Diagnosis not present

## 2019-10-19 ENCOUNTER — Other Ambulatory Visit: Payer: Self-pay

## 2019-10-19 ENCOUNTER — Ambulatory Visit: Payer: Federal, State, Local not specified - PPO | Admitting: Cardiology

## 2019-10-19 ENCOUNTER — Encounter: Payer: Self-pay | Admitting: Cardiology

## 2019-10-19 VITALS — BP 110/70 | HR 53 | Ht 74.0 in | Wt 236.8 lb

## 2019-10-19 DIAGNOSIS — I5020 Unspecified systolic (congestive) heart failure: Secondary | ICD-10-CM

## 2019-10-19 DIAGNOSIS — U071 COVID-19: Secondary | ICD-10-CM

## 2019-10-19 NOTE — Patient Instructions (Addendum)
Medication Instructions:  °No medication changes. °*If you need a refill on your cardiac medications before your next appointment, please call your pharmacy* ° ° °Lab Work: °None ordered °If you have labs (blood work) drawn today and your tests are completely normal, you will receive your results only by: °• MyChart Message (if you have MyChart) OR °• A paper copy in the mail °If you have any lab test that is abnormal or we need to change your treatment, we will call you to review the results. ° ° °Testing/Procedures: °None ordered ° ° °Follow-Up: °At [facility], you and your health needs are our priority.  As part of our continuing mission to provide you with exceptional heart care, we have created designated Provider Care Teams.  These Care Teams include your primary Cardiologist (physician) and Advanced Practice Providers (APPs -  Physician Assistants and Nurse Practitioners) who all work together to provide you with the care you need, when you need it. ° °We recommend signing up for the patient portal called "MyChart".  Sign up information is provided on this After Visit Summary.  MyChart is used to connect with patients for Virtual Visits (Telemedicine).  Patients are able to view lab/test results, encounter notes, upcoming appointments, etc.  Non-urgent messages can be sent to your provider as well.   °To learn more about what you can do with MyChart, go to [email or web address].   ° °Your next appointment:   °12 month(s) ° °The format for your next appointment:   °In Person ° °Provider:   °Kardie Tobb, DO ° ° °Other Instructions °NA ° °

## 2019-10-19 NOTE — Progress Notes (Signed)
Cardiology Office Note:    Date:  10/19/2019   ID:  Jesus Lopez, DOB [birth year], MRN [id number]  PCP:  Marylen Ponto, MD  Cardiologist:  Donato Schultz, MD  Electrophysiologist:  None   Referring MD: Marylen Ponto, MD   "I am doing fine"  History of Present Illness:    Jesus Lopez is a 24 y.o. 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  The patient presents for follow-up visit.  Patient was last seen by Dr. Anne Fu on 04/23/2019.  At that time it was recommended that the patient undergo cardiac MRI as well as a treadmill stress test.  In the interim he was able to get this testing done.  Today he tells me he has been doing significantly well.  He denies any shortness of breath.  He says that he is progressively gotten well and he has had no shortness of breath.  He notes that he is doing everything he did before Covid and more.  In January he reported that he had some shortness of breath as well as some numbness but the patient adamantly tells me that all of the symptoms has resolved.  He also denies any chest pain.   Past Medical History:  Diagnosis Date  . Acute idiopathic thrombocytopenic purpura (HCC)   . Body mass index 30.0-30.9, adult   . Cardiomyopathy, secondary (HCC)   . COVID-19   . Esophageal reflux   . Heart failure with reduced ejection fraction (HCC) 02/22/2019  . Hypoglycemia   . Loss of consciousness (HCC) 02/22/2019  . Seizures (HCC) 02/21/2019  . Syncope   . Syncope and collapse     Past Surgical History:  Procedure Laterality Date  . NO PAST SURGERIES      Current Medications: No outpatient medications have been marked as taking for the 10/19/19 encounter (Office Visit) with Thomasene Ripple, DO.     Allergies:   Gadolinium derivatives   Social History   Socioeconomic History  .  Marital status: Single    Spouse name: Not on file  . Number of children: Not on file  . Years of education: Not on file  . Highest education level: Not on file  Occupational History  . Not on file  Tobacco Use  . Smoking status: Former Smoker    Types: Cigarettes    Quit date: 02/07/2019    Years since quitting: 0.6  . Smokeless tobacco: Never Used  Vaping Use  . Vaping Use: Never used  Substance and Sexual Activity  . Alcohol use: Yes  . Drug use: Never  . Sexual activity: Yes  Other Topics Concern  . Not on file  Social History Narrative  . Not on file   Social Determinants of Health   Financial Resource Strain: Low Risk   . Difficulty of Paying Living Expenses: Not very hard  Food Insecurity: No Food Insecurity  . Worried About Programme researcher, broadcasting/film/video in the Last Year: Never true  . Ran Out of Food in the Last Year: Never true  Transportation Needs: No Transportation Needs  . Lack of Transportation (Medical): No  . Lack of Transportation (Non-Medical): No  Physical Activity: Unknown  . Days of Exercise per Week: Patient refused  . Minutes of Exercise per Session: Patient refused  Stress: Stress Concern Present  . Feeling of Stress : To  some extent  Social Connections: Unknown  . Frequency of Communication with Friends and Family: Patient refused  . Frequency of Social Gatherings with Friends and Family: Patient refused  . Attends Religious Services: Patient refused  . Active Member of Clubs or Organizations: Patient refused  . Attends Banker Meetings: Patient refused  . Marital Status: Patient refused     Family History: The patient's family history includes Arrhythmia in his father; Hypertension in his father and mother.  ROS:   Review of Systems  Constitution: Negative for decreased appetite, fever and weight gain.  HENT: Negative for congestion, ear discharge, hoarse voice and sore throat.   Eyes: Negative for discharge, redness, vision loss in  right eye and visual halos.  Cardiovascular: Negative for chest pain, dyspnea on exertion, leg swelling, orthopnea and palpitations.  Respiratory: Negative for cough, hemoptysis, shortness of breath and snoring.   Endocrine: Negative for heat intolerance and polyphagia.  Hematologic/Lymphatic: Negative for bleeding problem. Does not bruise/bleed easily.  Skin: Negative for flushing, nail changes, rash and suspicious lesions.  Musculoskeletal: Negative for arthritis, joint pain, muscle cramps, myalgias, neck pain and stiffness.  Gastrointestinal: Negative for abdominal pain, bowel incontinence, diarrhea and excessive appetite.  Genitourinary: Negative for decreased libido, genital sores and incomplete emptying.  Neurological: Negative for brief paralysis, focal weakness, headaches and loss of balance.  Psychiatric/Behavioral: Negative for altered mental status, depression and suicidal ideas.  Allergic/Immunologic: Negative for HIV exposure and persistent infections.    EKGs/Labs/Other Studies Reviewed:    The following studies were reviewed today:   EKG:  The ekg ordered today demonstrates sinus bradycardia, heart rate 53 bpm, compared to EKG done on 02/22/2019 patient was in sinus rhythm at that time with a heart rate of 69 bpm  Cardiac MRI done on 05/12/2019 FINDINGS: Left ventricle:  - Mild dilatation  - Low normal systolic function  - Normal native T1  - Normal native T2  LV EF: 51% (Normal 56-78%)  Absolute volumes:  LV EDV: (Normal 77-195 mL)  LV ESV: (Normal 19-72 mL)  LV SV: (Normal 51-133 mL)  CO: 7.9L/min (Normal 2.8-8.8 L/min)  Indexed volumes:  LV EDV: 59mL/sq-m (Normal 47-92 mL/sq-m)  LV ESV: 36mL/sq-m (Normal 13-30 mL/sq-m)  LV SV: 47mL/sq-m (Normal 32-62 mL/sq-m)  CI: 3.4L/min/sq-m (Normal 1.7-4.2 L/min/sq-m)  Right ventricle: Normal size and systolic function  RV EF:  52% (Normal 47-74%)  Absolute  volumes:  RV EDV: (Normal 88-227 mL)  RV ESV: 47mL (Normal 23-103 mL)  RV SV: (Normal 52-138 mL)  CO: 7.5L/min (Normal 2.8-8.8 L/min)  Indexed volumes:  RV EDV: 73mL/sq-m (Normal 55-105 mL/sq-m)  RV ESV: 83mL/sq-m (Normal 15-43 mL/sq-m)  RV SV: 59mL/sq-m (Normal 32-64 mL/sq-m)  CI: 3.2L/min/sq-m (Normal 1.7-4.2 L/min/sq-m)  Left atrium: Mild enlargement  Right atrium: Mild enlargement  IMPRESSION: 1. Limited exam as patient experienced anaphylaxis to gadolinium. Late gadolinium enhancement imaging was not done.  2. Mild LV dilatation with low normal systolic function (EF 51%). Normal native T2 suggests no myocardial edema, and normal global native T1 suggests no diffuse myocardial fibrosis (though no late gadolinium enhancement imaging to better evaluate for areas of focal myocardial fibrosis)  3.  Normal RV size and systolic function (EF 52%)  Exercise tolerance test 05/04/2019  The patient walked for a total of 9 minutes and 58 seconds of a standard Bruce protocol treadmill test. He achieved a peak heart rate of 155 which is only 79% predicted maximal heart rate.  There  were no ST or T wave changes to suggest ischemia.  Blood pressure response to exercise was normal.  This is interpreted as a nondiagnostic exercise test due to inability to reach target heart rate. There are no signs of ischemia at his maximal exercise effort. There was no QRS widening at peak exercise.   Zio monitor 04/20/2019  Sinus rhythm with average HR 75 bpm  No atrial fibrillation, no adverse arrhythmias  Occasional premature atrial complexes (symptom of fluttering noted).  Sinus bradycardia noted at night (40-50's) benign.    Recent Labs: 02/22/2019: ALT 16; TSH 1.765 05/12/2019: BUN 14; Creatinine, Ser 0.95; Hemoglobin 15.2; Platelets 232; Potassium 3.2; Sodium 138  Recent Lipid Panel No results found for: CHOL, TRIG, HDL, CHOLHDL, VLDL, LDLCALC,  LDLDIRECT  Physical Exam:    VS:  BP 110/70   Pulse (!) 53   Ht 6\' 2"  (1.88 m)   Wt 236 lb 12.8 oz (107.4 kg)   SpO2 97%   BMI 30.40 kg/m     Wt Readings from Last 3 Encounters:  10/19/19 236 lb 12.8 oz (107.4 kg)  05/12/19 227 lb (103 kg)  04/23/19 227 lb (103 kg)     GEN: Well nourished, well developed in no acute distress HEENT: Normal NECK: No JVD; No carotid bruits LYMPHATICS: No lymphadenopathy CARDIAC: S1S2 noted,RRR, no murmurs, rubs, gallops RESPIRATORY:  Clear to auscultation without rales, wheezing or rhonchi  ABDOMEN: Soft, non-tender, non-distended, +bowel sounds, no guarding. EXTREMITIES: No edema, No cyanosis, no clubbing MUSCULOSKELETAL:  No deformity  SKIN: Warm and dry NEUROLOGIC:  Alert and oriented x 3, non-focal PSYCHIATRIC:  Normal affect, good insight  ASSESSMENT:    1. Heart failure with reduced ejection fraction (HCC)   2. COVID-19    PLAN:     Compared to his echocardiogram from November 2020 his MRI has shown recovered ejection fraction.  He also, clinically he feels a lot better.  Her shortness of breath has completely resolved.  He feels back to his baseline.  Based on his clinical history and his objective diagnostic testing I feel that he is clinically to his baseline.  At this time there is no need for further testing as he has had extensive appropriate work-up.  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  This form was filled out with attached testing which included his cardiac MR, exercise tolerance test, ZIO monitor results, and previous echo.  The patient is in agreement with the above plan. The patient left the office in stable condition.  The patient will follow up in annually with Dr. December 2020 as appropriate.   Medication Adjustments/Labs and Tests Ordered: Current medicines are reviewed at length with the patient today.  Concerns regarding medicines are outlined above.  Orders Placed This Encounter   Procedures  . EKG 12-Lead   No orders of the defined types were placed in this encounter.   Patient Instructions  Medication Instructions:  No medication changes. *If you need a refill on your cardiac medications before your next appointment, please call your pharmacy*   Lab Work: None ordered If you have labs (blood work) drawn today and your tests are completely normal, you will receive your results only by: Anne Fu MyChart Message (if you have MyChart) OR . A paper copy in the mail If you have any lab test that is abnormal or we need to change your treatment, we will call you to review the results.   Testing/Procedures: None ordered   Follow-Up:  At [facility], you and your health needs are our priority.  As part of our continuing mission to provide you with exceptional heart care, we have created designated Provider Care Teams.  These Care Teams include your primary Cardiologist (physician) and Advanced Practice Providers (APPs -  Physician Assistants and Nurse Practitioners) who all work together to provide you with the care you need, when you need it.  We recommend signing up for the patient portal called "MyChart".  Sign up information is provided on this After Visit Summary.  MyChart is used to connect with patients for Virtual Visits (Telemedicine).  Patients are able to view lab/test results, encounter notes, upcoming appointments, etc.  Non-urgent messages can be sent to your provider as well.   To learn more about what you can do with MyChart, go to [email or web address].    Your next appointment:   12 months  The format for your next appointment:   In Person  Provider:   Thomasene Ripple, DO   Other Instructions NA     Adopting a Healthy Lifestyle.  Know what a healthy weight is for you (roughly BMI <25) and aim to maintain this   Aim for 7+ servings of fruits and vegetables daily   65-80+ fluid ounces of water or unsweet tea for healthy kidneys   Limit  to max 1 drink of alcohol per day; avoid smoking/tobacco   Limit animal fats in diet for cholesterol and heart health - choose grass fed whenever available   Avoid highly processed foods, and foods high in saturated/trans fats   Aim for low stress - take time to unwind and care for your mental health   Aim for 150 min of moderate intensity exercise weekly for heart health, and weights twice weekly for bone health   Aim for 7-9 hours of sleep daily   When it comes to diets, agreement about the perfect plan isnt easy to find, even among the experts. Experts at the [facility] [employer] developed an idea known as the Healthy Eating Plate. Just imagine a plate divided into logical, healthy portions.   The emphasis is on diet quality:   Load up on vegetables and fruits - one-half of your plate: Aim for color and variety, and remember that potatoes dont count.   Go for whole grains - one-quarter of your plate: Whole wheat, barley, wheat berries, quinoa, oats, brown rice, and foods made with them. If you want pasta, go with whole wheat pasta.   Protein power - one-quarter of your plate: Fish, chicken, beans, and nuts are all healthy, versatile protein sources. Limit red meat.   The diet, however, does go beyond the plate, offering a few other suggestions.   Use healthy plant oils, such as olive, canola, soy, corn, sunflower and peanut. Check the labels, and avoid partially hydrogenated oil, which have unhealthy trans fats.   If youre thirsty, drink water. Coffee and tea are good in moderation, but skip sugary drinks and limit milk and dairy products to one or two daily servings.   The type of carbohydrate in the diet is more important than the amount. Some sources of carbohydrates, such as vegetables, fruits, whole grains, and beans-are healthier than others.   Finally, stay active  Signed, Thomasene Ripple, DO  10/19/2019 1:56 PM    [facility]

## 2019-10-20 ENCOUNTER — Encounter: Payer: Self-pay | Admitting: *Deleted

## 2019-10-20 NOTE — Progress Notes (Addendum)
[facility]    Provider:  Dr Lucia Gaskins Requesting Provider: Marylen Ponto, MD Primary Care Provider:  Marylen Ponto, MD  CC:  Convulsion/syncope  HPI:  Jesus Lopez is a 24 y.o. male here as requested by Marylen Ponto, MD for syncope. 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  Patient apparently may have bipolar disorder he is in the process of being evaluated for bipolar disorder.  He was sent here to make sure he does not have seizures so he can be enrolled in the [employer] per requesting provider, I reviewed notes from November 2020, patient had a test for Covid which was positive and while sitting at dinner he passed out for a few minutes and woke up with numbness in the left side of the face and left arm and he went to the emergency room.  While in the emergency room, by report he said he had something similar 2 weeks prior after having sex but apparently no convulsions at that time, and also prior on July 4 after having too many drinks he had an episode of loss of consciousness with some convulsions per his friend,.  CT of the scan of the head was negative and he was started empirically on Keppra for possible seizures.  When speaking to neurology, he stated that he was sitting on a couch at dinner, preceded by tunnel vision and then gray vision followed by blue light in his vision and then syncopal event which he did not recall, no confusion afterwards, he also stated he had numbness in his left face and arm postevent which lasted 15 minutes, in addition he discussed 2 weeks prior he was standing up from having sex with his girlfriend and he felt lightheaded and weak followed by tunnellike vision again and passed out and had convulsions a lasted for approximately 10  seconds also followed by some numbness in the left as well as confusion, there is a history of a spell of confusion in 2019 while he was operating machinery and another episode on July 4 of 2020 he recalls that he was throwing cardboard into a fire pit while intoxicated on 8 beers and about a quarter of a medium sized bottle of moonshine when he suddenly did not feel well and laid down on the ground the other campfire, his friends gave him "Snickers bars" and post eating a snack his friends noticed that he clenched up and started jerking and that this occurred approximately for 1 minute.  He was confused after the spell, the following week he stated that he felt off and was sore all over, he had no memory of any of the events on his own, relating to physician team what witnesses told him it happened he was diagnosed with hypoglycemia patient had a glucose of 89 while he was at [facility].  It was unclear by neurology what these were, they did state these were unlikely to be TIAs, with the prodrome of tunnel vision followed by graying of vision and passing out episodes of orthostatic hypotension or arrhythmia followed by syncopal conventions were possible, also high in the diagnosis was new onset epilepsy given the lack of memory for the events and postictal confusion described by the patient, also possibility was of alcohol induced seizures at least in a few of the  instances.  EEG on 02/22/2019 was normal.  Patient came in to get established recently with Dr. Leonor Liv, patient reported that his therapist recommended seeing a psychiatrist due to similar bipolar type symptoms which his mom says are also very similar to symptoms of thyroid so he wanted his thyroid checked, mother wanted him checked to be sure there is nothing physically causing the symptoms prior to being put in a mood stabilizer, he has been dealing with various mood swings, he will feel explosive and then will calm down, he has been seeing a  therapist and they thought possibly he had a bipolar condition, there is strong incidence of thyroid disorders and they wanted him to clear his thyroid before starting a mood stabilizer, his appointment with [facility] center in [place] was apparently around the middle of July, he was also identified as having a cardiac condition and seeing cardiology and he needed them to finish his paperwork clearing him completely for the [employer], he also needs a neurology clearance stating he does not suffer from a convulsive condition, he asked for these referrals.  Past medical history ITP, acid reflux, possible bipolar disorder.  Examination was normal, multiple labs were checked including free T4, TSH, thyroglobulin and thyroxine binding globulin, CBC, CMP.  I reviewed results Free T4 was normal, TSH was within normal limits, CBC was normal, CMP was normal with BUN 14 and creatinine 0.9 these labs were collected October 07, 2019 also thyroglobulin antibody was negative.  Patient was admitted in November 2020 for loss of consciousness with a history of multiple episodes of loss of consciousness some with convulsions, he was discharged on Keppra and he was supposed to follow-up but never did.  In the meantime he stopped the Keppra on his own, he did not follow-up with neurology, and now he is here asking me to fill out a form stating he does not have seizures and he can drive. In November he had syncopal episodes, found to have heart issues secondary to covid. 2 weeks prior during sex had a syncopal event afterwards but no convulsions with shortness of breath. Prior to that July 4th 2020 was drinking heavily and had syncopal episode with reported convulsions. The first time had episodes was extremely drunk and lost consciousness, possible confusion stated but patient was already very druk, gave him snickers and he felt better, thought it was due to being drunl, never had an episode similar, no family history of seizures or  personal history of seizures, was fine until he had covid and developed CM and started having SOB on standing and syncopal events without convulsions, without tingue biting, no urination and he denies ever having any of these. In between he has had no issues, no tingue biting or urinationint he middle of night, no loss of time, no loss of consciousness, no episodes of confusion, no car accidets or accidents in the home, nothing whatsoever.     Reviewed notes, labs and imaging from outside physicians, which showed:  I reviewed emergency room notes patient was seen February third for anaphylaxis while getting a cardiac MRI at after contrast dye was administered, he became hypotensive, had urticaria, shortness of breath, sensation of his throat closing, there was no loss of consciousness, he was treated with EpiPen x2, IV Benadryl, Medrol and Pepcid.  I personally reviewed MRI of the brain February 22, 2019 which was normal.  Review of Systems: Patient complains of symptoms per HPI as well as the following symptoms: denies any current symptoms. Pertinent  negatives and positives per HPI. All others negative.   Social History   Socioeconomic History  . Marital status: Single    Spouse name: Not on file  . Number of children: Not on file  . Years of education: 14  . Highest education level: Not on file  Occupational History    Comment: Curator  Tobacco Use  . Smoking status: Current Every Day Smoker    Packs/day: 1.00    Types: Cigarettes    Last attempt to quit: 02/07/2019    Years since quitting: 0.7  . Smokeless tobacco: Never Used  Vaping Use  . Vaping Use: Never used  Substance and Sexual Activity  . Alcohol use: Not Currently    Comment: quit  . Drug use: Never  . Sexual activity: Yes  Other Topics Concern  . Not on file  Social History Narrative   Lives alone   Caffeine- 1 energy drink   Social Determinants of Health   Financial Resource Strain: Low Risk   . Difficulty  of Paying Living Expenses: Not very hard  Food Insecurity: No Food Insecurity  . Worried About Programme researcher, broadcasting/film/video in the Last Year: Never true  . Ran Out of Food in the Last Year: Never true  Transportation Needs: No Transportation Needs  . Lack of Transportation (Medical): No  . Lack of Transportation (Non-Medical): No  Physical Activity: Unknown  . Days of Exercise per Week: Patient refused  . Minutes of Exercise per Session: Patient refused  Stress: Stress Concern Present  . Feeling of Stress : To some extent  Social Connections: Unknown  . Frequency of Communication with Friends and Family: Patient refused  . Frequency of Social Gatherings with Friends and Family: Patient refused  . Attends Religious Services: Patient refused  . Active Member of Clubs or Organizations: Patient refused  . Attends Banker Meetings: Patient refused  . Marital Status: Patient refused  Intimate Partner Violence: Not At Risk  . Fear of Current or Ex-Partner: No  . Emotionally Abused: No  . Physically Abused: No  . Sexually Abused: No    Family History  Problem Relation Age of Onset  . Hypertension Mother   . Hypertension Father   . Arrhythmia Father        with cardiac arrest    Past Medical History:  Diagnosis Date  . Acute idiopathic thrombocytopenic purpura (HCC)   . Anxiety   . Body mass index 30.0-30.9, adult   . Cardiomyopathy, secondary (HCC)   . COVID-19    hospitalized  . Esophageal reflux   . Heart failure with reduced ejection fraction (HCC) 02/22/2019  . Hypoglycemia   . Loss of consciousness (HCC) 02/22/2019  . Seizures (HCC) 02/21/2019  . Syncope   . Syncope and collapse     Patient Active Problem List   Diagnosis Date Noted  . Heart failure with reduced ejection fraction (HCC) 02/22/2019  . Loss of consciousness (HCC) 02/22/2019  . COVID-19 02/21/2019    Past Surgical History:  Procedure Laterality Date  . NO PAST SURGERIES      Current  Outpatient Medications  Medication Sig Dispense Refill  . omeprazole (PRILOSEC) 40 MG capsule Take 40 mg by mouth daily.     No current facility-administered medications for this visit.    Allergies as of 10/21/2019 - Review Complete 10/21/2019  Allergen Reaction Noted  . 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 No Carotid Bruits. No peripheral edema, warm, nontender Eyes: Conjunctivae clear without exudates or hemorrhage  Neuro: Detailed Neurologic Exam  Speech:    Speech is normal; fluent and spontaneous with normal comprehension.  Cognition:    The patient is oriented to person, place, and time;     recent and remote memory intact;     language fluent;     normal attention, concentration,     fund of knowledge Cranial Nerves:    The pupils are equal, round, and reactive to light. The fundi are normal and spontaneous venous pulsations are present. Visual fields are full to finger confrontation. Extraocular movements are intact. Trigeminal sensation is intact and the muscles of mastication are normal. The face is symmetric. The palate elevates in the midline. Hearing intact. Voice is normal. Shoulder shrug is normal. The tongue has normal motion without fasciculations.   Coordination:    Normal finger to nose and heel to shin. Normal rapid alternating movements.   Gait:    Heel-toe and tandem gait are normal.   Motor Observation:    No asymmetry, no atrophy, and no involuntary movements noted. Tone:    Normal muscle tone.    Posture:    Posture is normal. normal erect    Strength:    Strength  is V/V in the upper and lower limbs.      Sensation: intact to LT     Reflex Exam:  DTR's:    Deep tendon reflexes in the upper and lower extremities are normal bilaterally.   Toes:    The toes are downgoing bilaterally.   Clonus:    Clonus is absent.    Assessment/Plan:  23 year old gentleman who was admitted to [facility] hospital in November 2020 for syncopal event likely related to cardiomyopathy or orthostasis. Routine eeg and MRI brain unremarkable. Unfortunately he had several prior syncopal events that then brought in the question of possible underlying seizure disorder.  For example in July 2020 he was drinking excessively, was very drunk, lost consciousness and had jerking movements.  All the events could be explained by either drinking heavily or orthostasis  or Cardiomyopathy (in November he had cardiomyopathy secondary to Covid which could explain the syncope at that time), however he was discharged on Keppra, stopped taking it on his own , and never followed up.  He is here 7 months later and would like a form filled out so that he can continue driving, he is a Curator, and he also participates in the [employer].  I told patient I was not able to "clear" him although all the episodes could be explainable by something other than an underlying seizure/epilepsy disorder.  I did tell him that I would like him to have an extended inpatient EEG  And if normal and he has not had any episodes in a year starting the time clock from last November when he was hospitalized, would be appropriate to resume normal activities such as driving.   -  I think his episodes can be explained by alcohol abuse, orthostatic syncope and his cardiomyopathy secondary to covid. I did discuss that unfortunately he needs further evaluation, I recommend an extended EEG monitoring unit extended test and he is very agreeable - If negative EEG above, and no further episodes, he can return to driving after one year  (November, 2020).   Per [place] laws: Discussed [place] 6-12 months Some exceptions apply.  Physician certification of fitness to drive must be submitted. DMV Medical Advisor determines licensing eligibility based on special circumstances and on a case-by-case basis. Treatment compliance is also considered.  Per [facility] statutes, patients with seizures are not allowed to drive until they have been seizure-free for six months.    Use caution when using heavy equipment or power tools. Avoid working on ladders or at heights. Take showers instead of baths. Ensure the water temperature is not too high on the home water heater. Do not go swimming alone. Do not lock yourself in a room alone (i.e. bathroom). When caring for infants or small children, sit down when holding, feeding, or changing them to minimize risk of injury to the child in the event you have a seizure. Maintain good sleep hygiene. Avoid alcohol.    If patient has another seizure, call 911 and bring them back to the ED if: A.  The seizure lasts longer than 5 minutes.      B.  The patient doesn't wake shortly after the seizure or has new problems such as difficulty seeing, speaking or moving following the seizure C.  The patient was injured during the seizure D.  The patient has a temperature over 102 F (39C) E.  The patient vomited during the seizure and now is having trouble breathing  Per [facility] statutes, patients with seizures are not allowed to drive until they have been seizure-free for six months.  Other recommendations include using caution when using heavy equipment or power tools. Avoid working on ladders or at heights. Take showers instead of baths.  Do not swim alone.  Ensure the water temperature is not too high on the home water heater. Do not go swimming alone. Do not lock yourself in a room alone (i.e. bathroom). When caring for infants or small children, sit down when holding, feeding,  or changing them to minimize risk of injury to the child in the event you have a seizure. Maintain good sleep hygiene. Avoid alcohol.  Also recommend adequate sleep, hydration, good diet and minimize stress.  During the Seizure  - First, ensure adequate ventilation and place patients on the floor on their left side  Loosen clothing around the neck and ensure the airway is patent. If the patient is clenching the teeth, do not force the mouth open with any object as this can cause severe damage - Remove all items from the surrounding that can be hazardous. The patient may be oblivious to what's happening and may not even know what he or she is doing. If the patient is confused and wandering, either gently guide him/her away and block access to outside areas - Reassure the individual and be comforting - Call 911. In most cases, the seizure ends before EMS arrives. However, there are cases when seizures may last over 3 to 5 minutes. Or the individual may have developed breathing difficulties or severe injuries. If a pregnant patient or a person with diabetes develops a seizure, it is prudent to call an ambulance. - Finally,  if the patient does not regain full consciousness, then call EMS. Most patients will remain confused for about 45 to 90 minutes after a seizure, so you must use judgment in calling for help. - Avoid restraints but make sure the patient is in a bed with padded side rails - Place the individual in a lateral position with the neck slightly flexed; this will help the saliva drain from the mouth and prevent the tongue from falling backward - Remove all nearby furniture and other hazards from the area - Provide verbal assurance as the individual is regaining consciousness - Provide the patient with privacy if possible - Call for help and start treatment as ordered by the caregiver   fter the Seizure (Postictal Stage)  After a seizure, most patients experience confusion, fatigue, muscle  pain and/or a headache. Thus, one should permit the individual to sleep. For the next few days, reassurance is essential. Being calm and helping reorient the person is also of importance.  Most seizures are painless and end spontaneously. Seizures are not harmful to others but can lead to complications such as stress on the lungs, brain and the heart. Individuals with prior lung problems may develop labored breathing and respiratory distress.   Orders Placed This Encounter  Procedures  . Ambulatory referral to Neurology   No orders of the defined types were placed in this encounter.   Cc: Dulce Sellar, NP,  Marylen Ponto, MD  Naomie Dean, MD  [facility] [address] [place], [place] [address]  Phone [phone] Fax [phone]  I spent 90 minutes of face-to-face and non-face-to-face time with patient on the  1. Syncope, unspecified syncope type   2. Convulsions, unspecified convulsion type (HCC)    diagnosis.  This included previsit chart review, lab review, study review, order entry, electronic health record documentation, patient education on the different diagnostic and therapeutic options, counseling and coordination of care, risks and benefits of management, compliance, or risk factor reduction

## 2019-10-21 ENCOUNTER — Other Ambulatory Visit: Payer: Self-pay

## 2019-10-21 ENCOUNTER — Ambulatory Visit: Payer: Federal, State, Local not specified - PPO | Admitting: Neurology

## 2019-10-21 ENCOUNTER — Encounter: Payer: Self-pay | Admitting: Neurology

## 2019-10-21 VITALS — BP 125/68 | HR 77 | Ht 75.0 in | Wt 247.0 lb

## 2019-10-21 DIAGNOSIS — R569 Unspecified convulsions: Secondary | ICD-10-CM | POA: Diagnosis not present

## 2019-10-21 DIAGNOSIS — R55 Syncope and collapse: Secondary | ICD-10-CM | POA: Diagnosis not present

## 2019-10-21 NOTE — Patient Instructions (Signed)
3-day EEG    Seizure, Adult A seizure is a sudden burst of abnormal electrical activity in the brain. Seizures usually last from 30 seconds to 2 minutes. The abnormal activity temporarily interrupts normal brain function. A seizure can cause many different symptoms depending on where in the brain it starts. What are the causes? Common causes of this condition include:  Fever or infection.  Brain abnormality, injury, bleeding, or tumor.  Low blood sugar.  Metabolic disorders or other conditions that are passed from parent to child (are inherited).  Reaction to a substance, such as a drug or a medicine, or suddenly stopping the use of a substance (withdrawal).  Stroke.  Developmental disorders such as autism or cerebral palsy. In some cases, the cause of this condition may not be known. Some people who have a seizure never have another one. Seizures usually do not cause brain damage or permanent problems unless they are prolonged. A person who has repeated seizures over time without a clear cause has a condition called epilepsy. What increases the risk? You are more likely to develop this condition if you have:  A family history of epilepsy.  Had a tonic-clonic seizure in the past. This is a type of seizure that involves whole-body contraction of muscles and a loss of consciousness.  Autism, cerebral palsy, or other brain disorders.  A history of head trauma, lack of oxygen at birth, or strokes. What are the signs or symptoms? There are many different types of seizures. The symptoms of a seizure vary depending on the type of seizure you have. Examples of symptoms during a seizure include:  Uncontrollable shaking (convulsions).  Stiffening of the body.  Loss of consciousness.  Head nodding.  Staring.  Not responding to sound or touch.  Loss of bladder or bowel control. Some people have symptoms right before a seizure happens (aura) and right after a seizure happens  (postictal). Symptoms before a seizure may include:  Fear or anxiety.  Nausea.  Feeling like the room is spinning (vertigo).  A feeling of having seen or heard something before (dj vu).  Odd tastes or smells.  Changes in vision, such as seeing flashing lights or spots. Symptoms after a seizure may include:  Confusion.  Sleepiness.  Headache.  Weakness on one side of the body. How is this diagnosed? This condition may be diagnosed based on:  A description of your symptoms. Video of your seizures can be helpful.  Your medical history.  A physical exam. You may also have tests, including:  Blood tests.  CT scan.  MRI.  Electroencephalogram (EEG). This test measures electrical activity in the brain. An EEG can predict whether seizures will return (recur).  A spinal tap (also called a lumbar puncture). This is the removal and testing of fluid that surrounds the brain and spinal cord. How is this treated? Most seizures will stop on their own in under 5 minutes, and no treatment is needed. Seizures that last longer than 5 minutes will usually need treatment. Treatment can include:  Medicines given through an IV.  Avoiding known triggers, such as medicines that you take for another condition.  Medicines to treat epilepsy (antiepileptics), if epilepsy caused your seizures.  Surgery to stop seizures, if you have epilepsy that does not respond to medicines. Follow these instructions at home: Medicines  Take over-the-counter and prescription medicines only as told by your health care provider.  Avoid any substances that may prevent your medicine from working properly, such as alcohol.  Activity  Do not drive, swim, or do any other activities that would be dangerous if you had another seizure. Wait until your health care provider says it is safe to do them.  If you live in the [place] check with your local DMV (department of motor vehicles) to find out about local  driving laws. Each state has specific rules about when you can legally return to driving.  Get enough rest. Lack of sleep can make seizures more likely to occur. Educating others Teach friends and family what to do if you have a seizure. They should:  Lay you on the ground to prevent a fall.  Cushion your head and body.  Loosen any tight clothing around your neck.  Turn you on your side. If vomiting occurs, this helps keep your airway clear.  Not hold you down. Holding you down will not stop the seizure.  Not put anything into your mouth.  Know whether or not you need emergency care. For example, they should get help right away if you have a seizure that lasts longer than 5 minutes or have several seizures in a row.  Stay with you until you recover.  General instructions  Contact your health care provider each time you have a seizure.  Avoid anything that has ever triggered a seizure for you.  Keep a seizure diary. Record what you remember about each seizure, especially anything that might have triggered the seizure.  Keep all follow-up visits as told by your health care provider. This is important. Contact a health care provider if:  You have another seizure.  You have seizures more often.  Your seizure symptoms change.  You continue to have seizures with treatment.  You have symptoms of an infection or illness. This might increase your risk of having a seizure. Get help right away if:  You have a seizure that: ? Lasts longer than 5 minutes. ? Is different than previous seizures. ? Leaves you unable to speak or use a part of your body. ? Makes it harder to breathe.  You have: ? A seizure after a head injury. ? Multiple seizures in a row. ? Confusion or a severe headache right after a seizure.  You do not wake up immediately after a seizure.  You injure yourself during a seizure. These symptoms may represent a serious problem that is an emergency. Do not wait  to see if the symptoms will go away. Get medical help right away. Call your local emergency services (911 in the [place] Do not drive yourself to the hospital. Summary  Seizures are caused by abnormal electrical activity in the brain. The activity disrupts normal brain function and can cause various symptoms, such as convulsions, abnormal movements, or a change in consciousness.  There are many causes of seizures, including illnesses, medicines, genetic conditions, head injuries, strokes, tumors, substance abuse, or substance withdrawal.  Most seizures will stop on their own in under 5 minutes. Seizures that last longer than 5 minutes are a medical emergency and require immediate treatment.  Many medicines are used to treat seizures. Take over-the-counter and prescription medicines only as told by your health care provider. This information is not intended to replace advice given to you by your health care provider. Make sure you discuss any questions you have with your health care provider. Document Revised: 06/12/2018 Document Reviewed: 06/12/2018 Elsevier Patient Education  2020 [employer].

## 2019-10-24 ENCOUNTER — Encounter: Payer: Self-pay | Admitting: Neurology

## 2019-11-08 ENCOUNTER — Telehealth: Payer: Self-pay | Admitting: *Deleted

## 2019-11-08 NOTE — Telephone Encounter (Signed)
Medical disposition form for [facility] completed by Dr. Lucia Gaskins, signed on 11/04/19. Sent to medical records for processing.

## 2020-04-02 IMAGING — MR MR HEAD WO/W CM
11 of 15 series · 30 of 48 positions shown · IV contrast (gadavist)
Comparison: [facility] CT without contrast 02/21/2019.

CLINICAL DATA: 23-year-old male with recent [id number]. Diagnosis.
Seizure.

EXAM:
MRI HEAD WITHOUT AND WITH CONTRAST
TECHNIQUE: Multiplanar, multiecho pulse sequences of the brain and surrounding
structures were obtained without and with intravenous contrast.
CONTRAST:  10mL GADAVIST GADOBUTROL 1 MMOL/ML IV SOLN
The patient experienced nausea and vomiting after contrast
injection.

[Series 2: DWI · axial · 3.0mm · 0.94mm/px · z∈[-80,+78]mm · 7 of 108 slices shown (1 of 2)]
[im 1/108]
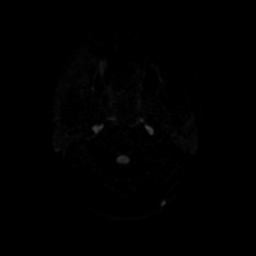
[im 18/108]
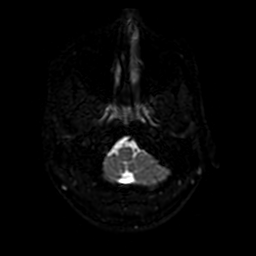
[im 36/108]
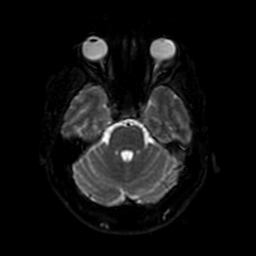
[im 54/108]
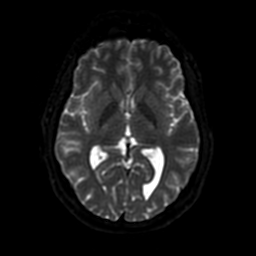
[im 72/108]
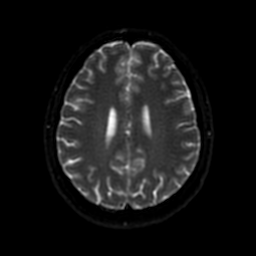
[im 90/108]
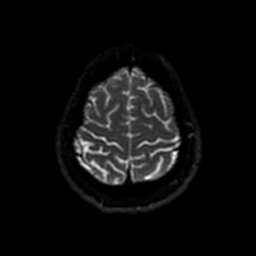
[im 108/108]
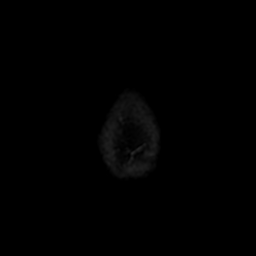

[Series 3: DWI · coronal · 4.0mm · 0.94mm/px · 5 of 76 slices shown (2 of 2)]
[im 1/76]
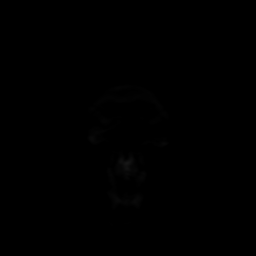
[im 19/76]
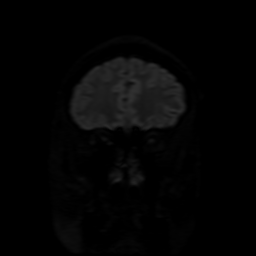
[im 38/76]
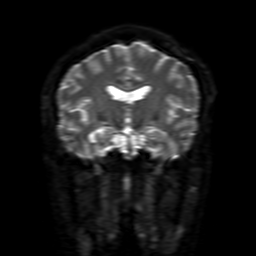
[im 57/76]
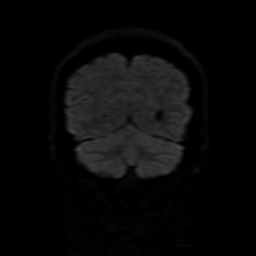
[im 76/76]
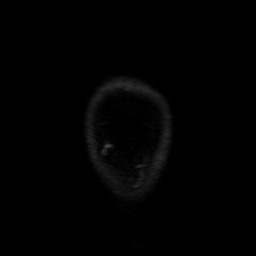

[Series 4: FLAIR · sagittal · 5.0mm · 0.47mm/px · 1 of 23 slices shown (1 of 3)]
[im 1/23]
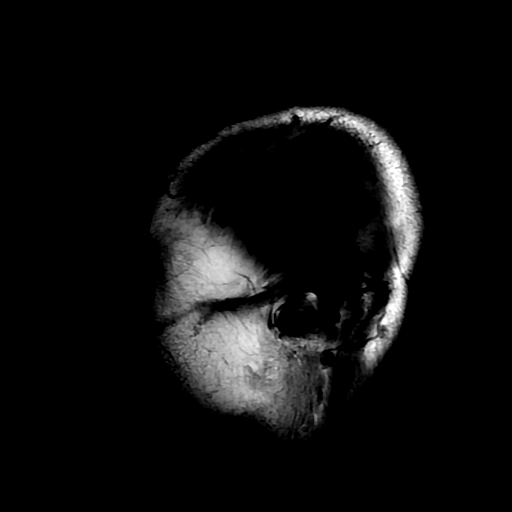

[Series 5: T2 · axial · 5.0mm · 0.47mm/px · z∈[-77,+67]mm · 2 of 25 slices shown]
[im 1/25]
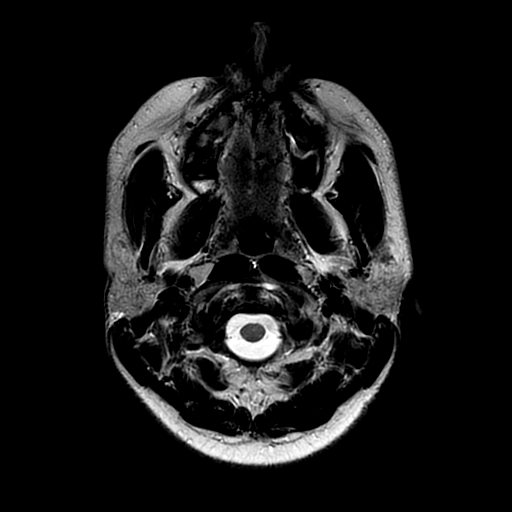
[im 25/25]
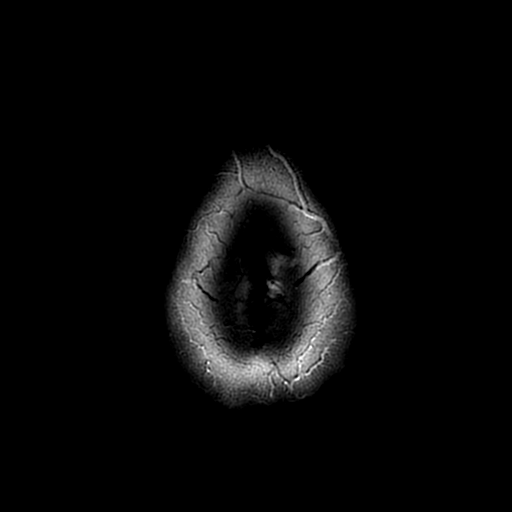

[Series 6: FLAIR · axial · 3.0mm · 0.47mm/px · z∈[-77,+67]mm · 2 of 25 slices shown (2 of 3)]
[im 1/25]
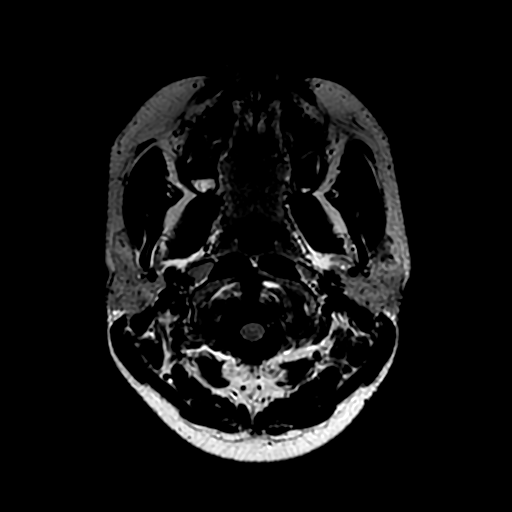
[im 25/25]
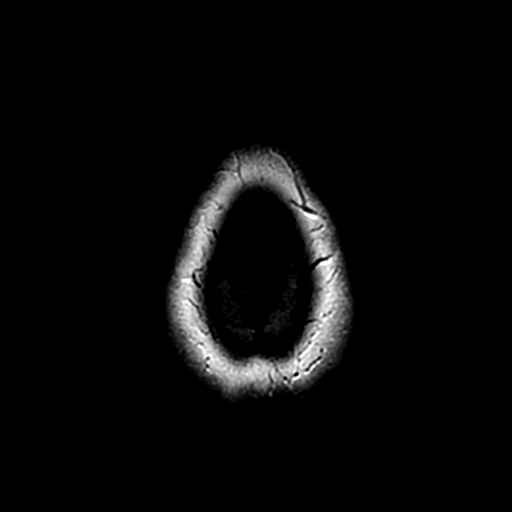

[Series 7: T2 fat-sat · coronal · 3.0mm · 0.43mm/px · 2 of 34 slices shown]
[im 1/34]
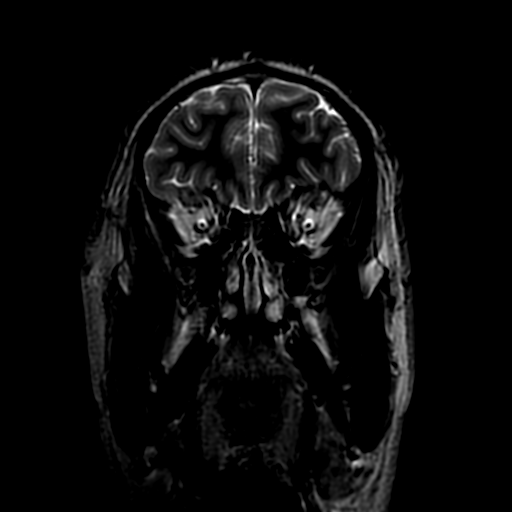
[im 34/34]
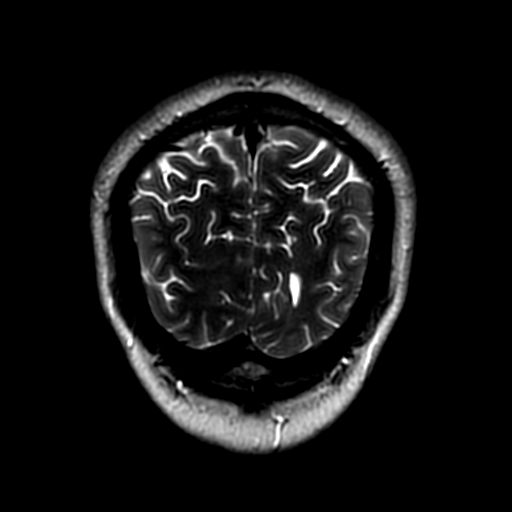

[Series 8: FLAIR · coronal · 3.0mm · 0.43mm/px · 2 of 34 slices shown (3 of 3)]
[im 1/34]
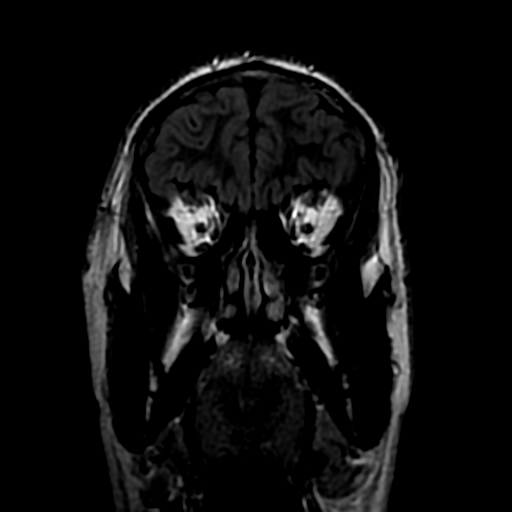
[im 34/34]
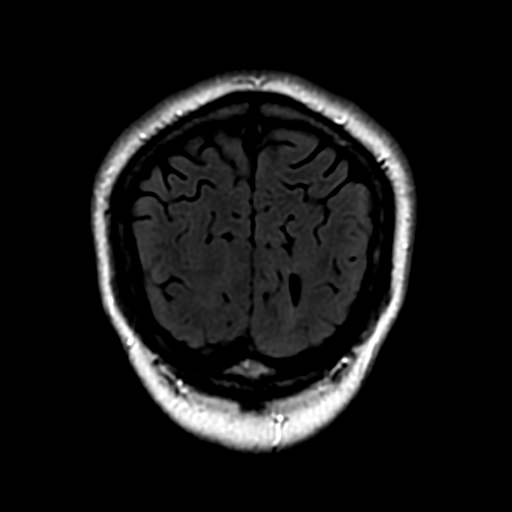

[Series 9: SWI · axial · 3.0mm · 0.47mm/px · z∈[-84,-56]mm · 2 of 100 slices shown]
[im 1/100]
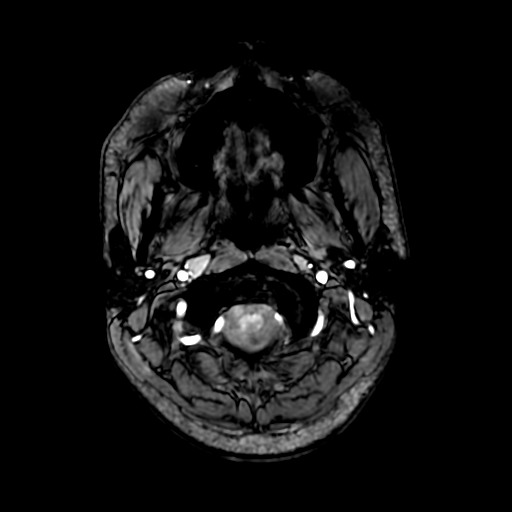
[im 20/100]
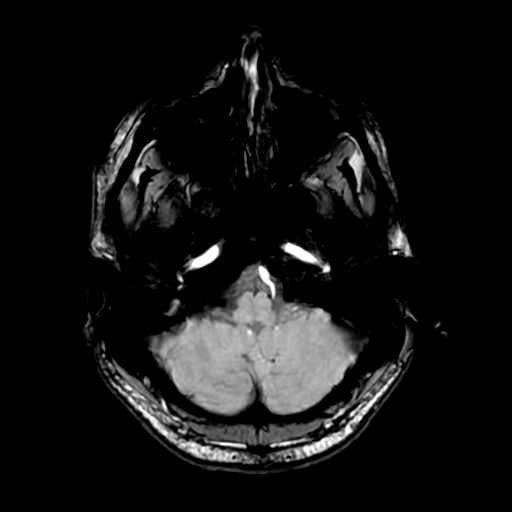

[Series 14: T2 post-contrast · coronal · 5.0mm · 0.39mm/px · 2 of 27 slices shown]
[im 1/27]
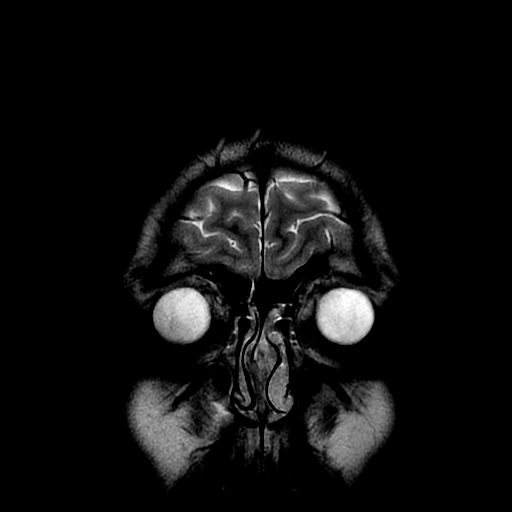
[im 27/27]
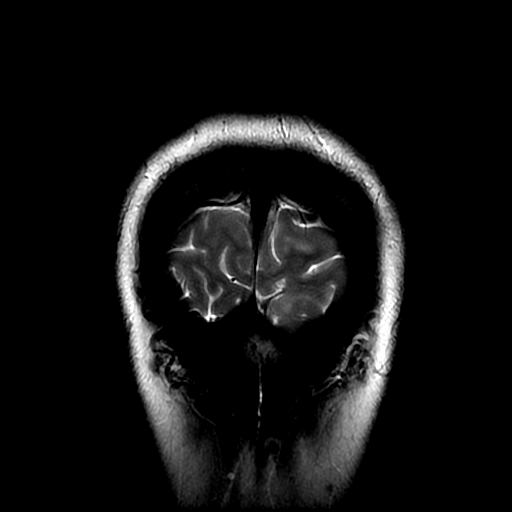

[Series 250: ADC · axial · 3.0mm · 0.94mm/px · z∈[-80,+78]mm · 3 of 54 slices shown (1 of 2)]
[im 1/54]
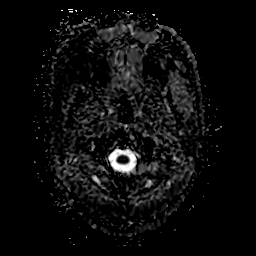
[im 27/54]
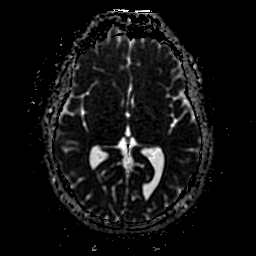
[im 54/54]
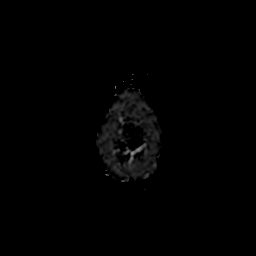

[Series 350: ADC · coronal · 4.0mm · 0.94mm/px · 2 of 38 slices shown (2 of 2)]
[im 1/38]
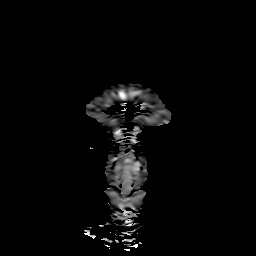
[im 38/38]
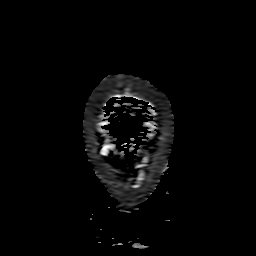

[30 of 48 positions shown; findings below may reference images not displayed]

FINDINGS: Brain: Cerebral volume is within normal limits. Mild asymmetry of
the occipital horns appears to be normal variation.

No restricted diffusion to suggest acute infarction. No midline
shift, mass effect, evidence of mass lesion, ventriculomegaly,
extra-axial collection or acute intracranial hemorrhage.
Cervicomedullary junction and pituitary are within normal limits.

Thin slice coronal T2 and FLAIR images demonstrate no definite
hippocampal asymmetry or signal abnormality.

Gray and white matter signal is within normal limits throughout the
brain. No chronic cerebral blood products.

No abnormal enhancement identified. No dural thickening.

Vascular: Major intracranial vascular flow voids are preserved. The
right vertebral artery appears dominant. The major dural venous
sinuses are enhancing and appear to be patent.

Skull and upper cervical spine: Negative visible cervical spine.
Visualized bone marrow signal is within normal limits.

Sinuses/Orbits: Negative orbits. Trace paranasal sinus mucosal
thickening.

Other: Mastoids are clear. Visible internal auditory structures
appear normal.

Scalp and face soft tissues appear negative.
IMPRESSION: No acute intracranial abnormality. Normal MRI appearance of the
brain.

## 2020-04-24 ENCOUNTER — Ambulatory Visit: Payer: Federal, State, Local not specified - PPO | Admitting: Neurology

## 2021-01-04 ENCOUNTER — Ambulatory Visit: Payer: Federal, State, Local not specified - PPO | Admitting: Cardiology

## 2021-02-26 DIAGNOSIS — R490 Dysphonia: Secondary | ICD-10-CM | POA: Diagnosis not present

## 2021-02-26 DIAGNOSIS — J039 Acute tonsillitis, unspecified: Secondary | ICD-10-CM | POA: Diagnosis not present

## 2021-02-26 DIAGNOSIS — Z6828 Body mass index (BMI) 28.0-28.9, adult: Secondary | ICD-10-CM | POA: Diagnosis not present

## 2021-03-09 ENCOUNTER — Ambulatory Visit: Payer: Federal, State, Local not specified - PPO | Admitting: Cardiology

## 2021-03-12 DIAGNOSIS — Z77122 Contact with and (suspected) exposure to noise: Secondary | ICD-10-CM | POA: Diagnosis not present

## 2021-03-12 DIAGNOSIS — H9193 Unspecified hearing loss, bilateral: Secondary | ICD-10-CM | POA: Diagnosis not present

## 2021-03-12 DIAGNOSIS — R0981 Nasal congestion: Secondary | ICD-10-CM | POA: Diagnosis not present

## 2021-03-12 DIAGNOSIS — H9313 Tinnitus, bilateral: Secondary | ICD-10-CM | POA: Diagnosis not present

## 2021-07-02 DIAGNOSIS — J342 Deviated nasal septum: Secondary | ICD-10-CM | POA: Diagnosis not present

## 2021-07-02 DIAGNOSIS — R0981 Nasal congestion: Secondary | ICD-10-CM | POA: Diagnosis not present

## 2021-07-02 DIAGNOSIS — J343 Hypertrophy of nasal turbinates: Secondary | ICD-10-CM | POA: Diagnosis not present

## 2021-07-02 DIAGNOSIS — J312 Chronic pharyngitis: Secondary | ICD-10-CM | POA: Diagnosis not present

## 2021-07-18 DIAGNOSIS — F4310 Post-traumatic stress disorder, unspecified: Secondary | ICD-10-CM | POA: Diagnosis not present

## 2021-07-18 DIAGNOSIS — F3181 Bipolar II disorder: Secondary | ICD-10-CM | POA: Diagnosis not present

## 2021-07-18 DIAGNOSIS — F902 Attention-deficit hyperactivity disorder, combined type: Secondary | ICD-10-CM | POA: Diagnosis not present

## 2021-07-18 DIAGNOSIS — F4010 Social phobia, unspecified: Secondary | ICD-10-CM | POA: Diagnosis not present

## 2021-07-24 DIAGNOSIS — W57XXXA Bitten or stung by nonvenomous insect and other nonvenomous arthropods, initial encounter: Secondary | ICD-10-CM | POA: Diagnosis not present

## 2021-07-24 DIAGNOSIS — J324 Chronic pansinusitis: Secondary | ICD-10-CM | POA: Diagnosis not present

## 2021-07-30 DIAGNOSIS — J0391 Acute recurrent tonsillitis, unspecified: Secondary | ICD-10-CM | POA: Diagnosis not present

## 2021-07-30 DIAGNOSIS — D3705 Neoplasm of uncertain behavior of pharynx: Secondary | ICD-10-CM | POA: Diagnosis not present

## 2021-07-30 DIAGNOSIS — J312 Chronic pharyngitis: Secondary | ICD-10-CM | POA: Diagnosis not present

## 2021-07-30 DIAGNOSIS — J342 Deviated nasal septum: Secondary | ICD-10-CM | POA: Diagnosis not present

## 2021-08-15 DIAGNOSIS — D3709 Neoplasm of uncertain behavior of other specified sites of the oral cavity: Secondary | ICD-10-CM | POA: Diagnosis not present

## 2021-08-15 DIAGNOSIS — J342 Deviated nasal septum: Secondary | ICD-10-CM | POA: Diagnosis not present

## 2021-08-15 DIAGNOSIS — D1039 Benign neoplasm of other parts of mouth: Secondary | ICD-10-CM | POA: Diagnosis not present

## 2021-08-15 DIAGNOSIS — J3501 Chronic tonsillitis: Secondary | ICD-10-CM | POA: Diagnosis not present

## 2021-08-15 DIAGNOSIS — J343 Hypertrophy of nasal turbinates: Secondary | ICD-10-CM | POA: Diagnosis not present

## 2021-08-15 DIAGNOSIS — J312 Chronic pharyngitis: Secondary | ICD-10-CM | POA: Diagnosis not present

## 2021-08-15 DIAGNOSIS — R0981 Nasal congestion: Secondary | ICD-10-CM | POA: Diagnosis not present

## 2021-08-15 DIAGNOSIS — J351 Hypertrophy of tonsils: Secondary | ICD-10-CM | POA: Diagnosis not present

## 2021-08-15 DIAGNOSIS — J0391 Acute recurrent tonsillitis, unspecified: Secondary | ICD-10-CM | POA: Diagnosis not present

## 2021-08-16 DIAGNOSIS — Z09 Encounter for follow-up examination after completed treatment for conditions other than malignant neoplasm: Secondary | ICD-10-CM | POA: Diagnosis not present

## 2021-08-23 DIAGNOSIS — Z09 Encounter for follow-up examination after completed treatment for conditions other than malignant neoplasm: Secondary | ICD-10-CM | POA: Diagnosis not present

## 2021-08-26 DIAGNOSIS — J358 Other chronic diseases of tonsils and adenoids: Secondary | ICD-10-CM | POA: Diagnosis not present

## 2021-08-26 DIAGNOSIS — J95830 Postprocedural hemorrhage and hematoma of a respiratory system organ or structure following a respiratory system procedure: Secondary | ICD-10-CM | POA: Diagnosis not present

## 2021-08-26 DIAGNOSIS — K91840 Postprocedural hemorrhage and hematoma of a digestive system organ or structure following a digestive system procedure: Secondary | ICD-10-CM | POA: Diagnosis not present

## 2021-08-26 DIAGNOSIS — R58 Hemorrhage, not elsewhere classified: Secondary | ICD-10-CM | POA: Diagnosis not present

## 2021-08-26 DIAGNOSIS — R041 Hemorrhage from throat: Secondary | ICD-10-CM | POA: Diagnosis not present

## 2021-08-26 DIAGNOSIS — Z9889 Other specified postprocedural states: Secondary | ICD-10-CM | POA: Diagnosis not present

## 2021-08-31 DIAGNOSIS — F4310 Post-traumatic stress disorder, unspecified: Secondary | ICD-10-CM | POA: Diagnosis not present

## 2021-08-31 DIAGNOSIS — F3181 Bipolar II disorder: Secondary | ICD-10-CM | POA: Diagnosis not present

## 2021-08-31 DIAGNOSIS — F902 Attention-deficit hyperactivity disorder, combined type: Secondary | ICD-10-CM | POA: Diagnosis not present

## 2021-08-31 DIAGNOSIS — F4010 Social phobia, unspecified: Secondary | ICD-10-CM | POA: Diagnosis not present
